# Patient Record
Sex: Female | Born: 1980
Health system: Southern US, Community
[De-identification: ages and names within clinical notes are randomized; demographics above are authoritative.]

## PROBLEM LIST (undated history)

## (undated) DIAGNOSIS — E559 Vitamin D deficiency, unspecified: Secondary | ICD-10-CM

## (undated) DIAGNOSIS — R87612 Low grade squamous intraepithelial lesion on cytologic smear of cervix (LGSIL): Secondary | ICD-10-CM

## (undated) DIAGNOSIS — IMO0002 Reserved for concepts with insufficient information to code with codable children: Secondary | ICD-10-CM

## (undated) DIAGNOSIS — L732 Hidradenitis suppurativa: Secondary | ICD-10-CM

## (undated) DIAGNOSIS — D573 Sickle-cell trait: Secondary | ICD-10-CM

## (undated) DIAGNOSIS — D649 Anemia, unspecified: Secondary | ICD-10-CM

## (undated) DIAGNOSIS — B977 Papillomavirus as the cause of diseases classified elsewhere: Secondary | ICD-10-CM

## (undated) HISTORY — DX: Sickle-cell trait: D57.3

## (undated) HISTORY — DX: Low grade squamous intraepithelial lesion on cytologic smear of cervix (LGSIL): R87.612

## (undated) HISTORY — DX: Papillomavirus as the cause of diseases classified elsewhere: B97.7

## (undated) HISTORY — DX: Anemia, unspecified: D64.9

## (undated) HISTORY — DX: Reserved for concepts with insufficient information to code with codable children: IMO0002

## (undated) HISTORY — DX: Vitamin D deficiency, unspecified: E55.9

---

## 2007-10-25 ENCOUNTER — Inpatient Hospital Stay (HOSPITAL_COMMUNITY): Admission: EM | Admit: 2007-10-25 | Discharge: 2007-10-27 | Payer: Self-pay | Admitting: Emergency Medicine

## 2007-10-27 ENCOUNTER — Ambulatory Visit: Payer: Self-pay | Admitting: Psychiatry

## 2007-12-18 ENCOUNTER — Other Ambulatory Visit: Admission: RE | Admit: 2007-12-18 | Discharge: 2007-12-18 | Payer: Self-pay | Admitting: Gynecology

## 2008-06-14 ENCOUNTER — Ambulatory Visit: Payer: Self-pay | Admitting: Women's Health

## 2008-06-28 HISTORY — PX: COLPOSCOPY: SHX161

## 2008-06-28 HISTORY — PX: COLONOSCOPY: SHX174

## 2008-09-20 ENCOUNTER — Ambulatory Visit: Payer: Self-pay | Admitting: Women's Health

## 2008-12-12 ENCOUNTER — Ambulatory Visit: Payer: Self-pay | Admitting: Women's Health

## 2009-02-12 ENCOUNTER — Emergency Department (HOSPITAL_COMMUNITY): Admission: EM | Admit: 2009-02-12 | Discharge: 2009-02-12 | Payer: Self-pay | Admitting: Emergency Medicine

## 2009-02-14 ENCOUNTER — Ambulatory Visit: Payer: Self-pay | Admitting: Women's Health

## 2009-02-14 ENCOUNTER — Encounter: Payer: Self-pay | Admitting: Women's Health

## 2009-02-14 ENCOUNTER — Other Ambulatory Visit: Admission: RE | Admit: 2009-02-14 | Discharge: 2009-02-14 | Payer: Self-pay | Admitting: Gynecology

## 2009-05-16 ENCOUNTER — Ambulatory Visit: Payer: Self-pay | Admitting: Women's Health

## 2009-05-30 ENCOUNTER — Ambulatory Visit: Payer: Self-pay | Admitting: Gynecology

## 2009-08-07 ENCOUNTER — Ambulatory Visit: Payer: Self-pay | Admitting: Women's Health

## 2010-05-29 ENCOUNTER — Ambulatory Visit: Payer: Self-pay | Admitting: Women's Health

## 2010-09-02 ENCOUNTER — Other Ambulatory Visit: Payer: Self-pay | Admitting: Internal Medicine

## 2010-09-07 ENCOUNTER — Other Ambulatory Visit: Payer: Self-pay

## 2010-09-14 ENCOUNTER — Other Ambulatory Visit: Payer: Self-pay

## 2010-11-10 NOTE — H&P (Signed)
[name], [name] NO.:  [id number]   MEDICAL RECORD NO.:  [id number]         PATIENT TYPE:  LINP   LOCATION:                               FACILITY:  [facility]   PHYSICIAN:  Altha Harm, MDDATE OF BIRTH:  [birth year]   DATE OF ADMISSION:  10/25/2007  DATE OF DISCHARGE:                              HISTORY & PHYSICAL   CHIEF COMPLAINT:  Tylenol ingestion.   HISTORY OF PRESENT ILLNESS:  This is a 30 year old, African-American  female who today ingested 20 Tylenol PM pills approximately one hour  prior to arrival to the emergency room.  The patient denies homicidal or  suicidal mediations and states that she was an argument with her  boyfriend and got angry.  She states that as a result of the argument  she had a headache and wanted to be able to sleep so she took some  Tylenol PM.  She states that she took a few initially and was unable to  rest, so she took additional Tylenol PM.  She shtatre that she was  unaware that Tylenol could have a detromental effect.  Again, the  patient vehemently denies any suicidal ideations.  The patient states  that she started feeling dizzy and nauseated which brought her to the  emergency room.   The patient denies any fever or chills.  She denies any feelings of  depression.  She denies any diarrhea.   The patient has a young baby and states that she feels that she has  every reason to live.  She has a family in [place], and she states  that she has a good relationship with her family.   PAST MEDICAL HISTORY:  The patient has no chronic illnesses.   FAMILY HISTORY:  Is unremarkable according to the patient's  recollection.   SOCIAL HISTORY:  The patient lives with her infant child.  She denies  any tobacco, alcohol or drug use.  She is from the [place] area and  has no family in this general area.  Her mother's name is [name],  and her phone number is [phone].  Please note that the patient's  boyfriend is here, and they appear to have normal interaction.   MEDICATIONS:  The patient is not on any chronic medications.   ALLERGIES:  No known drug allergies.   PRIMARY CARE PHYSICIAN:  The patient has no primary care physician.   REVIEW OF SYSTEMS:  Fourteen systems are reviewed; all systems are  negative except as noted in the HPI.   PHYSICAL EXAMINATION:  The patient is sitting in bed.  She appears  nontoxic, and she is in no acute distress.  HEENT EXAMINATION:  She is normocephalic, atraumatic.  Pupils equally  round and reactive to light and accommodation.  Extraocular movements  are intact.  Tympanic membranes are translucent bilaterally with good  landmarks.  Fundi are benign.  Oropharynx is moist.  No exudate,  erythema or lesions are noted.  NECK EXAMINATION:  Trachea is midline.  No masses, no thyromegaly, no  JVD, no carotid bruit.  RESPIRATORY EXAMINATION:  The patient has  normal respiratory effort,  equal excursion bilaterally.  No wheezing or rhonchi noted.  CARDIOVASCULAR:  She is tachycardiac.  A normal S1-S2 is noted.  No  murmurs, rubs or gallops.  PMI is nondisplaced.  No heaves or thrills on  palpation.  ABDOMINAL EXAMINATION:  The patient abdomen is flat, soft, nontender,  nondistended.  No masses, no hepatosplenomegaly.  No guarding or  rebound.  LYMPH NODE SURVEY:  She had got no cervical, axillary, or inguinal  lymphadenopathy.  NEUROLOGICAL:  No focal neurological deficits.  Cranial nerves II-XII  are grossly intact.  DTRs are 2+ bilaterally upper and lower  extremities.  Sensation is intact to light touch, pinprick and  proprioception.  PSYCHIATRIC:  She is alert and oriented x3.  She has got good insight  and cognition.  The patient appears somewhat afraid.  She states that  she is scared of the effects that the Tylenol may have on her, stating  that she does not want to die.  She is afraid to go to sleep, that she  may not wake up.  She has good  recent and remote recall.  The patient  also is concerned about how this might effect her job if this  information is disclosed.  I have assured the patient the information  will not be disclosed except at her directives.  It will not be  discussed with anyone except at her directives secondary to HIPPA  guidelines.   ASSESSMENT/PLAN:  This is a patient with an intentional ingestion of  Tylenol PM.  The patient is without suicidal ideations at this time but  may have some features of depression.  I do not believe that this  patient requires a sitter at this time as she is not suicidal, but she  may benefit from psychiatric evaluation secondary to features of  depression.  The patient will be admitted.  She will be given aggressive  hydration.  The patient apparently has not reached her four hours beyond  her Tylenol ingestion.  Thus, we will do a four-hour limit on it.  The  patient will, however, be treated with Mucomyst 140 mg/kg loading dose  and then 70 mg/kg q.4h. thereafter to total 17 doses.  The patient was  placed on a monitored bed, and we will recheck her as acetaminophen  levels in a.m.  In addition, the patient will have her PT/PTT checked at  this time. Her liver enzymes and tylenol will be rechecked inthe  morning. I will also check and EKG for any evidence of Q-T prolongation.      Altha Harm, MD  Electronically Signed     MAM/MEDQ  D:  10/25/2007  T:  10/25/2007  Job:  [id number]

## 2010-11-10 NOTE — Consult Note (Signed)
[name], [name] NO.:  [id number]   MEDICAL RECORD NO.:  [id number]          PATIENT TYPE:  INP   LOCATION:  1412                         FACILITY:  [facility]   PHYSICIAN:  Anselm Jungling, MD  DATE OF BIRTH:  28-Mar-1981   DATE OF CONSULTATION:  10/27/2007  DATE OF DISCHARGE:                                 CONSULTATION   REFERRING PHYSICIAN:  Altha Harm, MD   IDENTIFYING DATA AND REASON FOR REFERRAL:  The patient is a 30 year old  unmarried mother, who works at the call center at [employer].  She  is currently under the care of Dr. Ashley Royalty here at [facility]  in the aftermath of a drug ingestion.   HISTORY OF THE PRESENTING PROBLEMS:  The patient indicates, quite freely  and openly, that she took a large quantity of Tylenol and Benadryl  following some differences with her boyfriend, and a strong desire to go  to sleep; this ultimately led to her needing medical treatment here.  She has been treated and is now medically cleared.   She has no psychiatric history.  She denies any history of prior  overdoses or suicide attempts or gestures.  Aside from some family  counseling, she has not had any form of treatment including medication  treatment.  She has no history of depression, and no history of alcohol  or substance abuse.  Today, Dr. Ashley Royalty is anticipating her release  from the hospital.   MENTAL STATUS AND OBSERVATIONS:  The patient is a slender, normally-  developed young woman who is wearing her own clothes this morning, in  anticipation of discharge.  She is alert, fully oriented, and very open,  pleasant, and forms eye contact well.  Her mood is essentially neutral,  but some sad affect is present.  She expresses a great deal of  appropriate remorse and regret over her drug ingestion, and gives strong  reassurances that this was not a suicide attempt, but instead, merely a  desire to sleep at a time when she felt she could  not sleep and was  rather overwhelmed.   Her thoughts and speech are normally organized.  There is nothing to  suggest any underlying formal thought disorder, cognitive or memory  impairment or psychosis.   We discussed the need for individual counseling, and she agreed with  this.  We talked about the possibility of her going to the counseling  center at [employer], her employer, but she states that she would rather seek  services other than her employer, which is understandable.  I gave her  the name of [facility] here in [place], and indicated to  her that they would be a good resource for counseling, and if necessary,  medication treatment.   IMPRESSIONS:  The patient does not really demonstrate clinical criteria  of major depressive disorder.  Rather, this appears to be a maladaptive  response to a stressful situation.  The patient appears to regret it  appropriately and sincerely.  I do not feel that she is at risk for  further self-harm.  She agrees to seek outpatient counseling.   DIAGNOSTIC IMPRESSION:   AXIS I:  Adjustment disorder with mixed disturbance of emotions and  conduct.   AXIS II:  Deferred.   AXIS III:  No acute or chronic illnesses.   AXIS IV:  Stressors, severe.   AXIS V:  Global assessment of functioning 70.   RECOMMENDATIONS:  I gave her the name and contact information for the  [facility].  I feel comfortable with her discharge home  today.  Cell phone [phone].      Anselm Jungling, MD  Electronically Signed     SPB/MEDQ  D:  10/27/2007  T:  10/27/2007  Job:  [id number]

## 2010-11-10 NOTE — Discharge Summary (Signed)
NAMELATESSA, [name]               ACCOUNT NO.:  [id number]   MEDICAL RECORD NO.:  [id number]          PATIENT TYPE:  INP   LOCATION:  1412                         [facility]   PHYSICIAN:  Altha Harm, MDDATE OF BIRTH:  1980/12/10   DATE OF ADMISSION:  10/25/2007  DATE OF DISCHARGE:  10/27/2007                               DISCHARGE SUMMARY   DISCHARGE DISPOSITION:  Home.   FINAL DISCHARGE DIAGNOSES:  1. Tylenol ingestion, accidental.  2. Diphenhydramine ingestion, accidental.   DISCHARGE MEDICATIONS:  None.   CONSULTANTS:  Phone consultation with Dr. Electa Sniff, psychiatry.   CODE STATUS:  Full code.   ALLERGIES:  No known drug allergies.   PROCEDURES:  None.   DIAGNOSTIC STUDIES:  None.   PERTINENT LABORATORY STUDIES:  At the time of discharge the patient had  normal liver function tests.  Twenty-four hours prior to her discharge  the patient had an acetaminophen level of less than 10.   CHIEF COMPLAINT:  Tylenol ingestion.   HISTORY OF PRESENT ILLNESS:  Please see the H&P dictated by Dr. Ashley Royalty  on April 29 for details of the HPI.  However, this is a 30 year old  patient who denied any suicidal or homicidal ideations.  She states that  she was disgusted with her boyfriend and wanted to sleep so she took  Tylenol P.M., which she usually takes to sleep at night.  The patient  states that she got no sleep only taking the initial two so she kept  taking them until she felt that she could go to sleep.  The patient  states that she then developed some nausea and lightheadedness, which  caused her to come to the emergency room.   HOSPITAL COURSE:  The patient was admitted to the hospital.  A Tylenol  level was obtained including a 4-hour level, which was 91.  The patient  was started on Mucomyst with a loading dose of 140 mg/kg and then  subsequent to that 70 mg/kg q.4 h.  The patient again reiterated that  she was neither homicidal nor suicidal and that she  was very hopeful in  terms of her life.  I discussed this with Dr. Electa Sniff of psychiatry ,  who felt that given the circumstances there was no need for any in  patient psychiatric evaluation.  The patient was in the hospital here  without a sitter and had no incident.  The patient improved clinically  while being supported with IV fluids.  She continued to have no  deterioration in her clinical status.  She had no elevation in her liver  enzymes or derangement of her coagulation function.  I discussed the  case with the poison control center, who stated that after the patient  had completed 10 doses she could be discharged as long as she had normal  LFTs and no clinical deterioration.  At this point the patient is stable  for discharge.  Her vital signs at this time are as follows:  Temperature 98, heart rate 87, blood pressure 108/63, respiratory rate  16, O2 saturations are 100% on room air.  ALT is 14, AST is 15, alkaline  phosphatase 41.  All other electrolytes are normal and her creatinine is  0.85 with a BUN of 6.  The patient is clinically stable for discharge.   MEDICATIONS AT DISCHARGE:  Zoloft 50 mg P.O. Daily.   FOLLOW-UP:  The patient is currently in family counseling, and she is to  continue in her family counseling.  The patient is to follow up with her  primary care physician on an as-needed basis.  She has been given out  patient  psychiatric resiurces by Dr. Electa Sniff to follow up as an pit  patient.      Altha Harm, MD  Electronically Signed     MAM/MEDQ  D:  10/27/2007  T:  10/27/2007  Job:  [id number]

## 2010-11-21 ENCOUNTER — Emergency Department (HOSPITAL_COMMUNITY): Payer: 59

## 2010-11-21 ENCOUNTER — Emergency Department (HOSPITAL_COMMUNITY)
Admission: EM | Admit: 2010-11-21 | Discharge: 2010-11-21 | Disposition: A | Discharge status: Home / Self Care | Payer: 59 | Attending: Emergency Medicine | Admitting: Emergency Medicine

## 2010-11-21 DIAGNOSIS — S8990XA Unspecified injury of unspecified lower leg, initial encounter: Secondary | ICD-10-CM | POA: Insufficient documentation

## 2010-11-21 DIAGNOSIS — S99929A Unspecified injury of unspecified foot, initial encounter: Secondary | ICD-10-CM | POA: Insufficient documentation

## 2010-11-21 DIAGNOSIS — M79609 Pain in unspecified limb: Secondary | ICD-10-CM | POA: Insufficient documentation

## 2010-11-21 DIAGNOSIS — M7989 Other specified soft tissue disorders: Secondary | ICD-10-CM | POA: Insufficient documentation

## 2010-11-21 DIAGNOSIS — W208XXA Other cause of strike by thrown, projected or falling object, initial encounter: Secondary | ICD-10-CM | POA: Insufficient documentation

## 2010-11-21 DIAGNOSIS — Y92009 Unspecified place in unspecified non-institutional (private) residence as the place of occurrence of the external cause: Secondary | ICD-10-CM | POA: Insufficient documentation

## 2010-11-21 DIAGNOSIS — S9030XA Contusion of unspecified foot, initial encounter: Secondary | ICD-10-CM | POA: Insufficient documentation

## 2011-03-23 LAB — HEPATIC FUNCTION PANEL
ALT: 16
AST: 22
Albumin: 3.3 — ABNORMAL LOW
Alkaline Phosphatase: 39
Alkaline Phosphatase: 46
Bilirubin, Direct: 0.1
Indirect Bilirubin: 0.6
Total Bilirubin: 0.8
Total Protein: 8

## 2011-03-23 LAB — DIFFERENTIAL
Eosinophils Relative: 4
Lymphocytes Relative: 17
Lymphs Abs: 1.6
Monocytes Absolute: 0.4
Neutro Abs: 7.1

## 2011-03-23 LAB — BASIC METABOLIC PANEL
GFR calc Af Amer: >60
GFR calc non Af Amer: >60
Potassium: 3.9
Sodium: 142

## 2011-03-23 LAB — RAPID URINE DRUG SCREEN, HOSP PERFORMED
Barbiturates: NOT DETECTED
Cocaine: NOT DETECTED
Opiates: NOT DETECTED
Tetrahydrocannabinol: NOT DETECTED

## 2011-03-23 LAB — APTT: aPTT: 33

## 2011-03-23 LAB — PHOSPHORUS: Phosphorus: 2.3

## 2011-03-23 LAB — MAGNESIUM: Magnesium: 1.9

## 2011-03-23 LAB — CBC
HCT: 42.9
Hemoglobin: 14.5
RBC: 5.5 — ABNORMAL HIGH
WBC: 9.5

## 2011-03-23 LAB — PROTIME-INR
INR: 1.2
Prothrombin Time: 15.7 — ABNORMAL HIGH

## 2011-03-23 LAB — ACETAMINOPHEN LEVEL: Acetaminophen (Tylenol), Serum: <10 — ABNORMAL LOW

## 2011-05-10 ENCOUNTER — Ambulatory Visit (INDEPENDENT_AMBULATORY_CARE_PROVIDER_SITE_OTHER): Payer: 59 | Admitting: Women's Health

## 2011-05-10 ENCOUNTER — Other Ambulatory Visit (HOSPITAL_COMMUNITY)
Admission: RE | Admit: 2011-05-10 | Discharge: 2011-05-10 | Disposition: A | Discharge status: Home / Self Care | Payer: 59 | Source: Ambulatory Visit | Attending: Women's Health | Admitting: Women's Health

## 2011-05-10 ENCOUNTER — Encounter: Payer: Self-pay | Admitting: Women's Health

## 2011-05-10 VITALS — BP 110/70 | Ht 66.25 in | Wt 149.0 lb

## 2011-05-10 DIAGNOSIS — B9689 Other specified bacterial agents as the cause of diseases classified elsewhere: Secondary | ICD-10-CM

## 2011-05-10 DIAGNOSIS — R87612 Low grade squamous intraepithelial lesion on cytologic smear of cervix (LGSIL): Secondary | ICD-10-CM | POA: Insufficient documentation

## 2011-05-10 DIAGNOSIS — N898 Other specified noninflammatory disorders of vagina: Secondary | ICD-10-CM

## 2011-05-10 DIAGNOSIS — B373 Candidiasis of vulva and vagina: Secondary | ICD-10-CM

## 2011-05-10 DIAGNOSIS — Z01419 Encounter for gynecological examination (general) (routine) without abnormal findings: Secondary | ICD-10-CM | POA: Insufficient documentation

## 2011-05-10 DIAGNOSIS — A499 Bacterial infection, unspecified: Secondary | ICD-10-CM

## 2011-05-10 DIAGNOSIS — N760 Acute vaginitis: Secondary | ICD-10-CM

## 2011-05-10 DIAGNOSIS — L293 Anogenital pruritus, unspecified: Secondary | ICD-10-CM

## 2011-05-10 DIAGNOSIS — Z309 Encounter for contraceptive management, unspecified: Secondary | ICD-10-CM

## 2011-05-10 DIAGNOSIS — IMO0002 Reserved for concepts with insufficient information to code with codable children: Secondary | ICD-10-CM | POA: Insufficient documentation

## 2011-05-10 DIAGNOSIS — IMO0001 Reserved for inherently not codable concepts without codable children: Secondary | ICD-10-CM

## 2011-05-10 MED ORDER — FLUCONAZOLE 150 MG PO TABS: 150.0000 mg | ORAL_TABLET | Freq: Once | ORAL | Status: AC

## 2011-05-10 MED ORDER — MEDROXYPROGESTERONE ACETATE 150 MG/ML IM SUSP: 150.0000 mg | Freq: Once | INTRAMUSCULAR | Status: DC

## 2011-05-10 MED ORDER — METRONIDAZOLE 0.75 % VA GEL: VAGINAL | Status: AC

## 2011-05-10 NOTE — Progress Notes (Signed)
[name] [birth year] [id number]    History:    The patient presents for annual exam.  30-year-old son Zyrere doing well, works at a bank.   Past medical history, past surgical history, family history and social history were all reviewed and documented in the EPIC chart.   ROS:  A  ROS was performed and pertinent positives and negatives are included in the history.  Exam:  Filed Vitals:   05/10/11 1155  BP: 110/70    General appearance:  Normal Head/Neck:  Normal, without cervical or supraclavicular adenopathy. Thyroid:  Symmetrical, normal in size, without palpable masses or nodularity. Respiratory  Effort:  Normal  Auscultation:  Clear without wheezing or rhonchi Cardiovascular  Auscultation:  Regular rate, without rubs, murmurs or gallops  Edema/varicosities:  Not grossly evident Abdominal  Soft,nontender, without masses, guarding or rebound.  Liver/spleen:  No organomegaly noted  Hernia:  None appreciated  Skin  Inspection:  Grossly normal  Palpation:  Grossly normal Neurologic/psychiatric  Orientation:  Normal with appropriate conversation.  Mood/affect:  Normal  Genitourinary    Breasts: Examined lying and sitting.     Right: Without masses, retractions, discharge or axillary adenopathy.     Left: Without masses, retractions, discharge or axillary adenopathy.   Inguinal/mons:  Normal without inguinal adenopathy  External genitalia:  Normal  BUS/Urethra/Skene's glands:  Normal  Bladder:  Normal  Vagina:  Normal  Cervix:  Normal  Uterus:   normal in size, shape and contour.  Midline and mobile  Adnexa/parametria:     Rt: Without masses or tenderness.   Lt: Without masses or tenderness.  Anus and perineum: Normal  Digital rectal exam: Normal sphincter tone without palpated masses or tenderness  Assessment/Plan:  30 y.o. SBF G2 P1  for annual exam monthly 5-7 day cycle/withdraw. Complaint of discharge. C&B in 2010 CIN-1 positive high-risk HPV, did not  return for followup Pap.  Contraceptive counseling BV and yeast  Plan: MetroGel vaginal cream 1 applicator at bedtime x5, and Diflucan 150 by mouth x1 dose. Prescriptions, proper use were given for both. Contraceptives discussed, used Depo-Provera in past, had no weight gain and was amenorrheic. Depo-Provera 150 IM every 12 weeks, prescription, proper use given, and abstain until next cycle and return for injection. Will send Pap recall for 6 months, did review view the importance of having 4 normal and then proceed with annual screening. SBEs, exercise, MVI daily, calcium rich diet encouraged. She does not plan more children.  UA and Pap. States had normal labs at primary care several months ago.   YOUNG,[name] WHNP, 1:40 PM 05/10/2011

## 2011-05-17 ENCOUNTER — Other Ambulatory Visit: Payer: 59

## 2011-05-17 ENCOUNTER — Ambulatory Visit (INDEPENDENT_AMBULATORY_CARE_PROVIDER_SITE_OTHER): Payer: 59 | Admitting: *Deleted

## 2011-05-17 ENCOUNTER — Ambulatory Visit: Payer: 59 | Admitting: Women's Health

## 2011-05-17 DIAGNOSIS — Z3049 Encounter for surveillance of other contraceptives: Secondary | ICD-10-CM

## 2011-05-17 MED ORDER — MEDROXYPROGESTERONE ACETATE 150 MG/ML IM SUSP
150.0000 mg | Freq: Once | INTRAMUSCULAR | Status: AC
  Administered 2011-05-17: 150 mg via INTRAMUSCULAR

## 2011-08-06 ENCOUNTER — Ambulatory Visit (INDEPENDENT_AMBULATORY_CARE_PROVIDER_SITE_OTHER): Payer: 59

## 2011-08-06 DIAGNOSIS — IMO0001 Reserved for inherently not codable concepts without codable children: Secondary | ICD-10-CM

## 2011-08-06 DIAGNOSIS — Z3049 Encounter for surveillance of other contraceptives: Secondary | ICD-10-CM

## 2011-08-06 DIAGNOSIS — Z309 Encounter for contraceptive management, unspecified: Secondary | ICD-10-CM

## 2011-08-06 DIAGNOSIS — Z01419 Encounter for gynecological examination (general) (routine) without abnormal findings: Secondary | ICD-10-CM

## 2011-08-06 MED ORDER — MEDROXYPROGESTERONE ACETATE 150 MG/ML IM SUSP: 150.0000 mg | Freq: Once | INTRAMUSCULAR | Status: DC

## 2011-08-06 MED ORDER — MEDROXYPROGESTERONE ACETATE 150 MG/ML IM SUSP
150.0000 mg | Freq: Once | INTRAMUSCULAR | Status: AC
  Administered 2011-08-06: 150 mg via INTRAMUSCULAR

## 2011-09-02 ENCOUNTER — Ambulatory Visit: Payer: 59

## 2011-09-02 ENCOUNTER — Ambulatory Visit (INDEPENDENT_AMBULATORY_CARE_PROVIDER_SITE_OTHER): Payer: 59 | Admitting: Family Medicine

## 2011-09-02 DIAGNOSIS — R059 Cough, unspecified: Secondary | ICD-10-CM

## 2011-09-02 DIAGNOSIS — J111 Influenza due to unidentified influenza virus with other respiratory manifestations: Secondary | ICD-10-CM

## 2011-09-02 DIAGNOSIS — R05 Cough: Secondary | ICD-10-CM

## 2011-09-02 DIAGNOSIS — M25561 Pain in right knee: Secondary | ICD-10-CM

## 2011-09-02 DIAGNOSIS — R509 Fever, unspecified: Secondary | ICD-10-CM

## 2011-09-02 DIAGNOSIS — J029 Acute pharyngitis, unspecified: Secondary | ICD-10-CM

## 2011-09-02 DIAGNOSIS — M25559 Pain in unspecified hip: Secondary | ICD-10-CM

## 2011-09-02 LAB — POCT INFLUENZA A/B
Influenza A, POC: NEGATIVE
Influenza B, POC: POSITIVE

## 2011-09-02 MED ORDER — OSELTAMIVIR PHOSPHATE 75 MG PO CAPS: 75.0000 mg | ORAL_CAPSULE | Freq: Two times a day (BID) | ORAL | Status: AC

## 2011-09-02 MED ORDER — IBUPROFEN 200 MG PO TABS: 600.0000 mg | ORAL_TABLET | Freq: Once | ORAL | Status: DC

## 2011-09-02 MED ORDER — HYDROCODONE-HOMATROPINE 5-1.5 MG/5ML PO SYRP: 5.0000 mL | ORAL_SOLUTION | Freq: Three times a day (TID) | ORAL | Status: AC | PRN

## 2011-09-02 MED ORDER — BENZONATATE 200 MG PO CAPS: 200.0000 mg | ORAL_CAPSULE | Freq: Two times a day (BID) | ORAL | Status: AC | PRN

## 2011-09-02 NOTE — Progress Notes (Signed)
  [facility]:  Office Visit  Chief Complaint:  Chief Complaint  Patient presents with  . Cough    x 4 days  progressing  nyquil, robitussin  not helping  . Fever    x 4 days      HPI: [name] is a 31 y.o. female who complains of 4 day h/o cough, fevers, chills, sore throat. Tried OTC meds without relief. Feels msk pain and generalized weakness. Patient was seen at another urgent care and given amoxacillin.   Past Medical History  Diagnosis Date  . LGSIL (low grade squamous intraepithelial lesion) on Pap smear 02/14/2009    CIN 1/VAIN-1, HPV detected  . High risk HPV infection 01/2009   Past Surgical History  Procedure Date  . Colposcopy 2010    C & B   History   Social History  . Marital Status: Single    Spouse Name: N/A    Number of Children: N/A  . Years of Education: N/A   Social History Main Topics  . Smoking status: Never Smoker   . Smokeless tobacco: Never Used  . Alcohol Use: No  . Drug Use: No  . Sexually Active: Yes -- Female partner(s)    Birth Control/ Protection: Coitus interruptus   Other Topics Concern  . None   Social History Narrative  . None   Family History  Problem Relation Age of Onset  . Diabetes Father   . Diabetes Maternal Grandmother    No Known Allergies Prior to Admission medications   Medication Sig Start Date End Date Taking? Authorizing Provider  AMOXICILLIN PO Take by mouth.   Yes Historical Provider, MD  medroxyPROGESTERone (DEPO-PROVERA) 150 MG/ML injection Inject 1 mL (150 mg total) into the muscle once. 08/06/11  Yes Harrington Challenger, NP     ROS: The patient denies fevers, chills, night sweats, unintentional weight loss, chest pain, palpitations, wheezing, dyspnea on exertion, nausea, vomiting, abdominal pain, dysuria, hematuria, melena, numbness, weakness, or tingling. + fever  All other systems have been reviewed and were otherwise negative with the exception of those mentioned in the HPI and as  above.    PHYSICAL EXAM: Filed Vitals:   09/02/11 1910  BP: 128/81  Pulse: 106  Temp: 102.9 F (39.4 C)  Resp: 16   Filed Vitals:   09/02/11 1910  Height: 5' 6.5" (1.689 m)  Weight: 150 lb (68.04 kg)   Body mass index is 23.85 kg/(m^2).  General: Alert, no acute distress HEENT:  Normocephalic, atraumatic, oropharynx patent. + erythematous nares, left greater than right, no sinus pressure. Tm normal. Erythematous OP. No exudates.  Cardiovascular:  Regular rate and rhythm, no rubs murmurs or gallops.  No Carotid bruits, radial pulse intact. No pedal edema.  Respiratory: Clear to auscultation bilaterally.  No wheezes, rales, or rhonchi.  No cyanosis, no use of accessory musculature GI: No organomegaly, abdomen is soft and non-tender, positive bowel sounds.  No masses. Skin: No rashes. Neurologic: Facial musculature symmetric. Psychiatric: Patient is appropriate throughout our interaction. Lymphatic: No cervical lymphadenopathy Musculoskeletal: Gait intact.   LABS: Flu B positive   EKG/XRAY:   Primary read interpreted by Dr. Conley Rolls at [facility]. Increased vascularization and hilar LAD but no infiltrates, pneumothorax   ASSESSMENT/PLAN: 1. Fever chills-Motrin and Tylenol prn 2. Cough-Tessalon and hydromet 3. Pharyngitis-sxs treatmetn with salt water gargles 4. Flu B Positive-Tamiflu    Marty Sadlowski PHUONG, DO 09/04/2011 11:33 AM

## 2011-10-29 ENCOUNTER — Ambulatory Visit: Payer: 59

## 2011-11-04 ENCOUNTER — Ambulatory Visit: Payer: 59 | Admitting: Women's Health

## 2012-01-14 ENCOUNTER — Ambulatory Visit (INDEPENDENT_AMBULATORY_CARE_PROVIDER_SITE_OTHER): Payer: 59 | Admitting: Anesthesiology

## 2012-01-14 ENCOUNTER — Ambulatory Visit (INDEPENDENT_AMBULATORY_CARE_PROVIDER_SITE_OTHER): Payer: 59 | Admitting: Women's Health

## 2012-01-14 DIAGNOSIS — N912 Amenorrhea, unspecified: Secondary | ICD-10-CM

## 2012-01-14 DIAGNOSIS — Z309 Encounter for contraceptive management, unspecified: Secondary | ICD-10-CM

## 2012-01-14 LAB — PREGNANCY, URINE: Preg Test, Ur: NEGATIVE

## 2012-01-14 MED ORDER — MEDROXYPROGESTERONE ACETATE 150 MG/ML IM SUSP
150.0000 mg | Freq: Once | INTRAMUSCULAR | Status: AC
  Administered 2012-01-14: 150 mg via INTRAMUSCULAR

## 2012-01-14 NOTE — Progress Notes (Signed)
Patient ID: [name], female   DOB: 19-Mar-1981, 31 y.o.   MRN: [id number] Presents for Depo-Provera, patient states give her last depo to herself.Prefers to have depo administered here. U PT negative today depo given with instructions to return in 12 weeks.

## 2012-07-06 ENCOUNTER — Ambulatory Visit (INDEPENDENT_AMBULATORY_CARE_PROVIDER_SITE_OTHER): Payer: 59 | Admitting: Gynecology

## 2012-07-06 DIAGNOSIS — Z309 Encounter for contraceptive management, unspecified: Secondary | ICD-10-CM

## 2012-07-06 DIAGNOSIS — IMO0001 Reserved for inherently not codable concepts without codable children: Secondary | ICD-10-CM

## 2012-07-06 MED ORDER — MEDROXYPROGESTERONE ACETATE 150 MG/ML IM SUSP
150.0000 mg | Freq: Once | INTRAMUSCULAR | Status: AC
  Administered 2012-07-06: 150 mg via INTRAMUSCULAR

## 2012-07-10 ENCOUNTER — Other Ambulatory Visit (HOSPITAL_COMMUNITY)
Admission: RE | Admit: 2012-07-10 | Discharge: 2012-07-10 | Disposition: A | Discharge status: Home / Self Care | Payer: 59 | Source: Ambulatory Visit | Attending: Women's Health | Admitting: Women's Health

## 2012-07-10 ENCOUNTER — Encounter: Payer: Self-pay | Admitting: Women's Health

## 2012-07-10 ENCOUNTER — Ambulatory Visit (INDEPENDENT_AMBULATORY_CARE_PROVIDER_SITE_OTHER): Payer: 59 | Admitting: Women's Health

## 2012-07-10 VITALS — BP 120/80 | Ht 66.0 in | Wt 157.0 lb

## 2012-07-10 DIAGNOSIS — Z309 Encounter for contraceptive management, unspecified: Secondary | ICD-10-CM

## 2012-07-10 DIAGNOSIS — Z01419 Encounter for gynecological examination (general) (routine) without abnormal findings: Secondary | ICD-10-CM

## 2012-07-10 DIAGNOSIS — R6889 Other general symptoms and signs: Secondary | ICD-10-CM

## 2012-07-10 DIAGNOSIS — IMO0002 Reserved for concepts with insufficient information to code with codable children: Secondary | ICD-10-CM

## 2012-07-10 DIAGNOSIS — IMO0001 Reserved for inherently not codable concepts without codable children: Secondary | ICD-10-CM

## 2012-07-10 DIAGNOSIS — Z1151 Encounter for screening for human papillomavirus (HPV): Secondary | ICD-10-CM | POA: Insufficient documentation

## 2012-07-10 MED ORDER — MEDROXYPROGESTERONE ACETATE 150 MG/ML IM SUSP: 150.0000 mg | Freq: Once | INTRAMUSCULAR | Status: DC

## 2012-07-10 NOTE — Patient Instructions (Signed)

## 2012-07-10 NOTE — Assessment & Plan Note (Signed)
Negative colposcopy 2010

## 2012-07-10 NOTE — Progress Notes (Signed)
[name] 09/06/1980 [id number]    History:    The patient presents for annual exam.  Amenorrheic on Depo-Provera.  Same partner. History of LGSIL/CIN-1 with positive HR HPV August 2010/ negative colposcopy. Normal Pap 04/2011.   Past medical history, past surgical history, family history and social history were all reviewed and documented in the EPIC chart. Works at  [employer]. Son [place] 6 and doing well.   ROS:  A  ROS was performed and pertinent positives and negatives are included in the history.  Exam:  Filed Vitals:   07/10/12 1116  BP: 120/80    General appearance:  Normal Head/Neck:  Normal, without cervical or supraclavicular adenopathy. Thyroid:  Symmetrical, normal in size, without palpable masses or nodularity. Respiratory  Effort:  Normal  Auscultation:  Clear without wheezing or rhonchi Cardiovascular  Auscultation:  Regular rate, without rubs, murmurs or gallops  Edema/varicosities:  Not grossly evident Abdominal  Soft,nontender, without masses, guarding or rebound.  Liver/spleen:  No organomegaly noted  Hernia:  None appreciated  Skin  Inspection:  Grossly normal  Palpation:  Grossly normal Neurologic/psychiatric  Orientation:  Normal with appropriate conversation.  Mood/affect:  Normal  Genitourinary    Breasts: Examined lying and sitting.     Right: Without masses, retractions, discharge or axillary adenopathy.     Left: Without masses, retractions, discharge or axillary adenopathy.   Inguinal/mons:  Normal without inguinal adenopathy  External genitalia:  Normal  BUS/Urethra/Skene's glands:  Normal  Bladder:  Normal  Vagina:  Normal  Cervix:  Normal  Uterus:   normal in size, shape and contour.  Midline and mobile  Adnexa/parametria:     Rt: Without masses or tenderness.   Lt: Without masses or tenderness.  Anus and perineum: Normal  Digital rectal exam: Normal sphincter tone without palpated masses or tenderness  Assessment/Plan:  32 y.o. SBF  G1 P1 for annual exam with no complaints.  LGSIL positive HR HPV 2010/negative colposcopy normal Pap 2012 Amenorrheic on Depo-Provera  Plan: Contraception options reviewed, Depo-Provera 150 every 12 weeks IM prescription, proper use given and reviewed. SBE's, exercise, importance of calcium rich diet reviewed, MVI daily. CBC, UA, Pap. Reviewed importance of annual exams, new screening guidelines reviewed.   Harrington Challenger WHNP, 1:21 PM 07/10/2012

## 2012-07-11 LAB — URINALYSIS W MICROSCOPIC + REFLEX CULTURE
Bacteria, UA: NONE SEEN
Crystals: NONE SEEN
Nitrite: NEGATIVE
Protein, ur: NEGATIVE mg/dL
Specific Gravity, Urine: 1.02 (ref 1.005–1.030)
Squamous Epithelial / LPF: NONE SEEN
Urobilinogen, UA: 0.2 mg/dL (ref 0.0–1.0)

## 2012-07-27 IMAGING — CR DG CHEST 2V
2 series · 2 of 2 positions shown · non-contrast
Comparison: None

CLINICAL DATA: Cough and fever

CHEST - 2 VIEW

[PA]
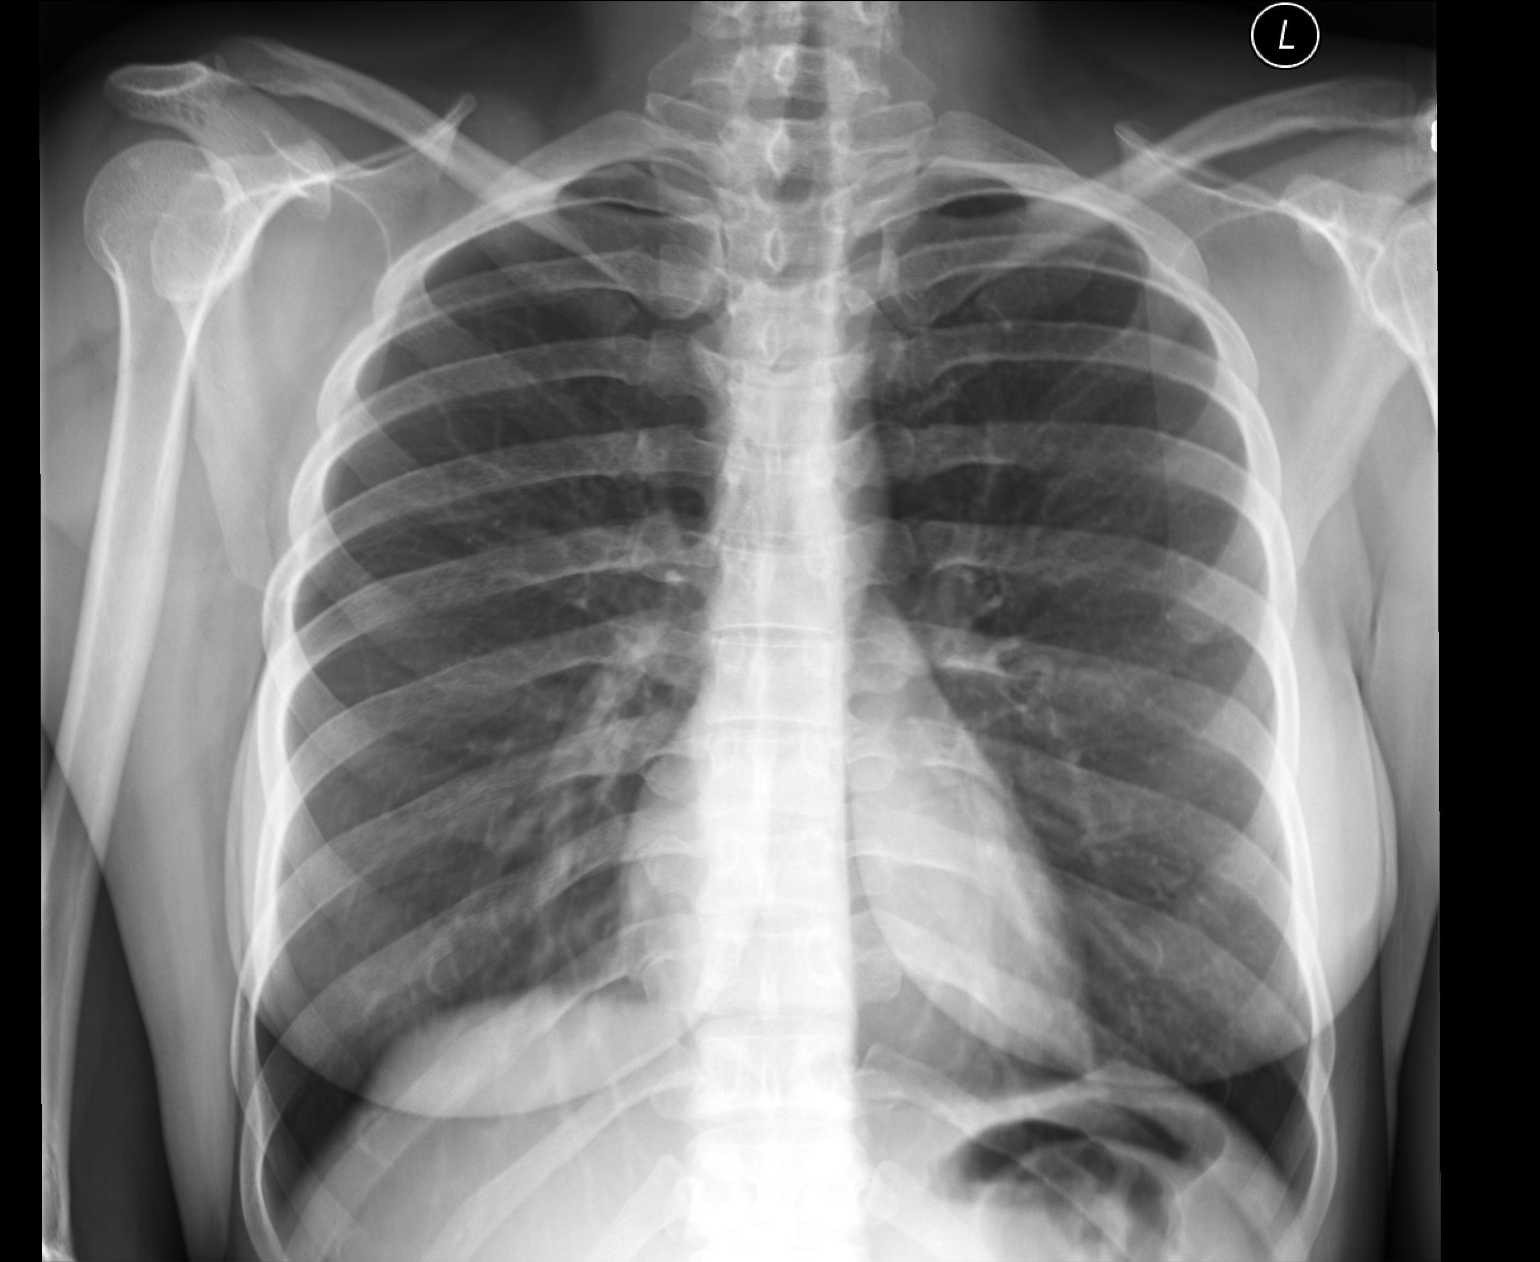

[lateral]
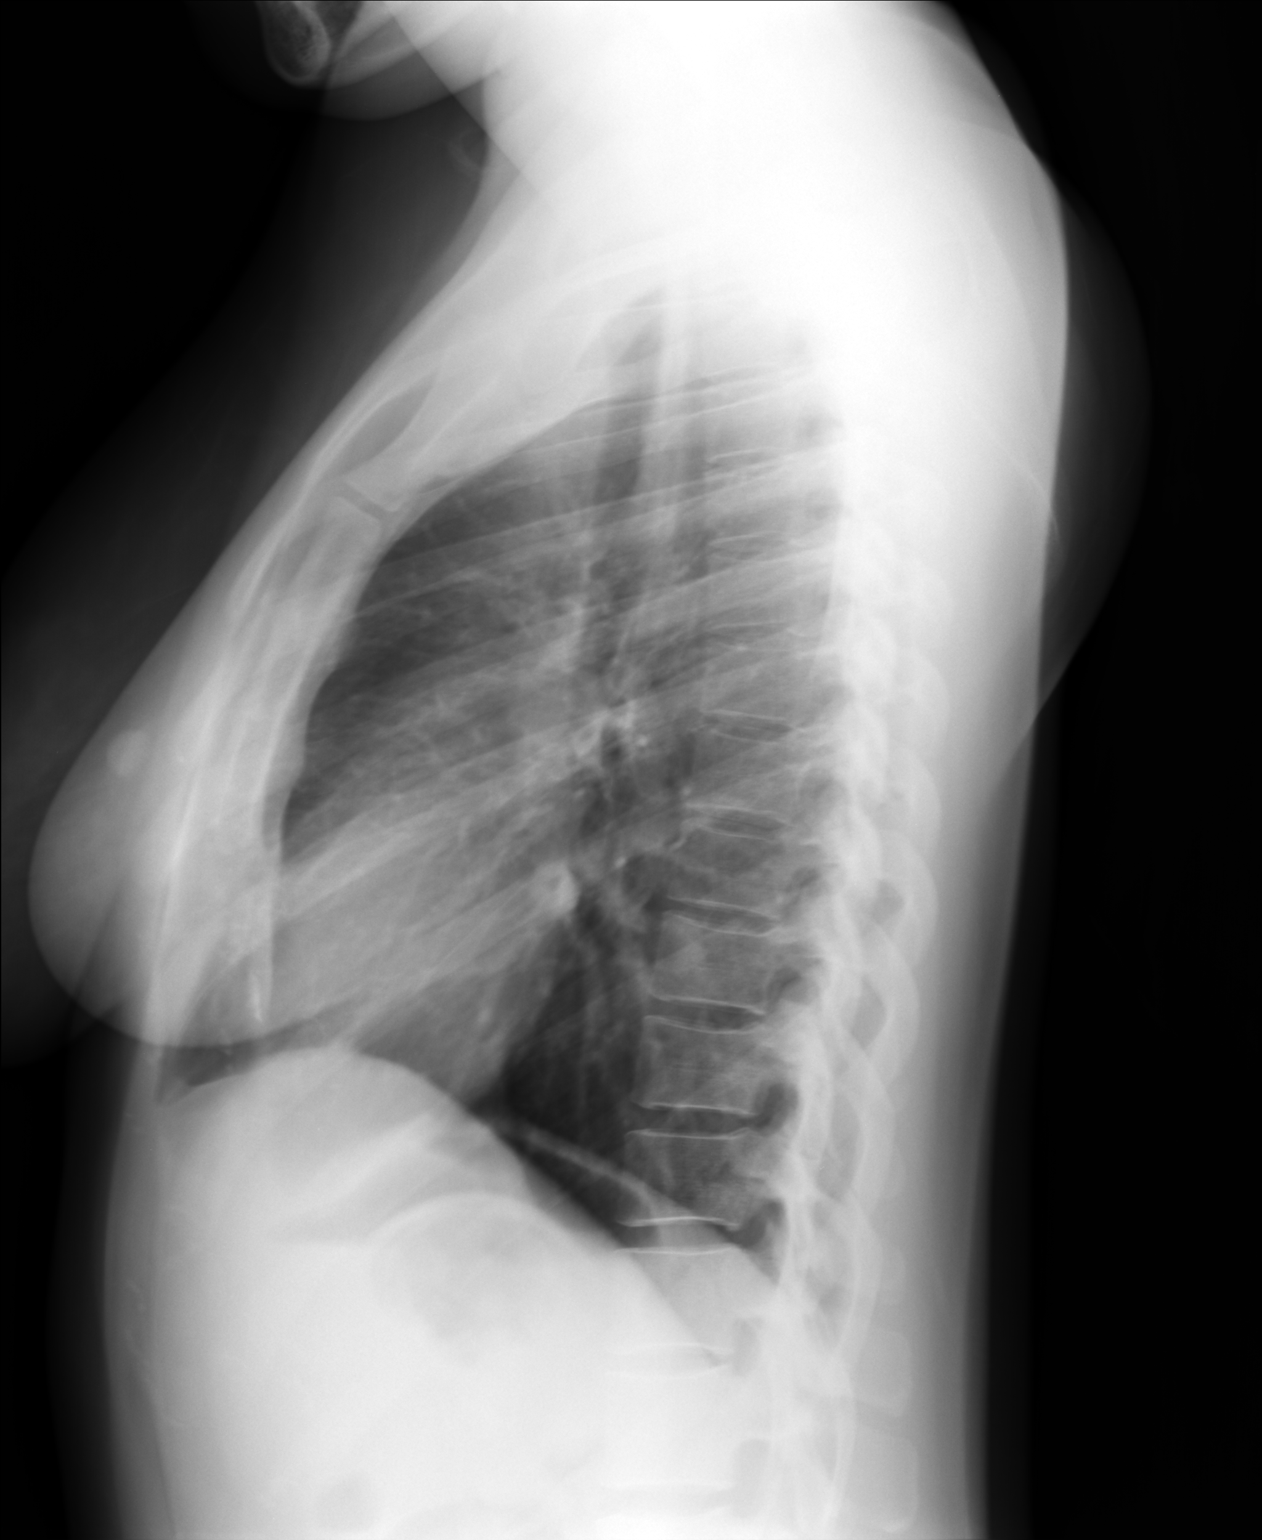

[2 of 2 positions shown; findings below may reference images not displayed]

FINDINGS: Heart size is normal.

No pleural effusion or pulmonary edema.

No airspace consolidation identified.

Review of the visualized osseous structures is unremarkable.
IMPRESSION: 1.  No active cardiopulmonary abnormalities.]

## 2012-09-22 ENCOUNTER — Ambulatory Visit (INDEPENDENT_AMBULATORY_CARE_PROVIDER_SITE_OTHER): Payer: 59 | Admitting: *Deleted

## 2012-09-22 DIAGNOSIS — Z3049 Encounter for surveillance of other contraceptives: Secondary | ICD-10-CM

## 2012-09-22 MED ORDER — MEDROXYPROGESTERONE ACETATE 150 MG/ML IM SUSP
150.0000 mg | Freq: Once | INTRAMUSCULAR | Status: AC
  Administered 2012-09-22: 150 mg via INTRAMUSCULAR

## 2012-12-04 ENCOUNTER — Encounter: Payer: Self-pay | Admitting: Women's Health

## 2012-12-04 ENCOUNTER — Ambulatory Visit (INDEPENDENT_AMBULATORY_CARE_PROVIDER_SITE_OTHER): Payer: 59 | Admitting: Women's Health

## 2012-12-04 DIAGNOSIS — Z113 Encounter for screening for infections with a predominantly sexual mode of transmission: Secondary | ICD-10-CM

## 2012-12-04 NOTE — Progress Notes (Signed)
Patient ID: [name], female   DOB: 19-Nov-1980, 32 y.o.   MRN: [id number] Presents with request for STD screen, long-term partner had multiple other partners prior to breakup. Asymptomatic, denies discharge, urinary symptoms, abdominal pain or fever. Contraceptives on Depo-Provera, due next week.  Exam: Tearful, external genitalia within normal limits, speculum exam cervix pink healthy without lesion or discharge visible, GC/Chlamydia culture taken. Bimanual no CMT or adnexal fullness or tenderness.  STD screen  Plan: GC/Chlamydia culture pending, HIV, hep B, C., RPR, HSV IgG. Condoms encouraged when sexually active. Counseling encouraged. Return as scheduled for Depo-Provera.

## 2012-12-04 NOTE — Patient Instructions (Addendum)

## 2012-12-05 LAB — HIV ANTIBODY (ROUTINE TESTING W REFLEX): HIV: NONREACTIVE

## 2012-12-05 LAB — HEPATITIS B SURFACE ANTIGEN: Hepatitis B Surface Ag: NEGATIVE

## 2012-12-05 LAB — GC/CHLAMYDIA PROBE AMP: GC Probe RNA: NEGATIVE

## 2012-12-06 ENCOUNTER — Other Ambulatory Visit: Payer: Self-pay | Admitting: Women's Health

## 2012-12-06 MED ORDER — VALACYCLOVIR HCL 500 MG PO TABS: ORAL_TABLET | ORAL | Status: DC

## 2012-12-08 ENCOUNTER — Telehealth: Payer: Self-pay | Admitting: *Deleted

## 2012-12-08 NOTE — Telephone Encounter (Signed)
Pt informed with the below note. 

## 2012-12-08 NOTE — Telephone Encounter (Signed)
Pt called this am to let you know that she has been having a outbreak on her chin that she believes maybe HSV outbreak which at the time she thought it was acne. She called requesting suppressive therapy medication, I explained to her that she could still use the valtrex 500 mg twice daily for 3-5 days and then daily after if needed for this as well since you gave her 12 refills. Please advise if addtional information you would like me to inform patient.

## 2012-12-08 NOTE — Telephone Encounter (Signed)
No, great advice, thanks

## 2013-07-07 ENCOUNTER — Encounter (HOSPITAL_COMMUNITY): Payer: Self-pay | Admitting: Emergency Medicine

## 2013-07-07 ENCOUNTER — Emergency Department (HOSPITAL_COMMUNITY)
Admission: EM | Admit: 2013-07-07 | Discharge: 2013-07-07 | Disposition: A | Discharge status: Home / Self Care | Payer: 59 | Attending: Emergency Medicine | Admitting: Emergency Medicine

## 2013-07-07 DIAGNOSIS — H9209 Otalgia, unspecified ear: Secondary | ICD-10-CM | POA: Insufficient documentation

## 2013-07-07 DIAGNOSIS — R509 Fever, unspecified: Secondary | ICD-10-CM | POA: Insufficient documentation

## 2013-07-07 DIAGNOSIS — R5381 Other malaise: Secondary | ICD-10-CM | POA: Insufficient documentation

## 2013-07-07 DIAGNOSIS — IMO0001 Reserved for inherently not codable concepts without codable children: Secondary | ICD-10-CM | POA: Insufficient documentation

## 2013-07-07 DIAGNOSIS — Z79899 Other long term (current) drug therapy: Secondary | ICD-10-CM | POA: Insufficient documentation

## 2013-07-07 DIAGNOSIS — J020 Streptococcal pharyngitis: Secondary | ICD-10-CM

## 2013-07-07 DIAGNOSIS — R599 Enlarged lymph nodes, unspecified: Secondary | ICD-10-CM | POA: Insufficient documentation

## 2013-07-07 DIAGNOSIS — R1110 Vomiting, unspecified: Secondary | ICD-10-CM | POA: Insufficient documentation

## 2013-07-07 DIAGNOSIS — R5383 Other fatigue: Secondary | ICD-10-CM

## 2013-07-07 LAB — RAPID STREP SCREEN (MED CTR MEBANE ONLY): Streptococcus, Group A Screen (Direct): POSITIVE — AB

## 2013-07-07 MED ORDER — PENICILLIN G BENZATHINE 1200000 UNIT/2ML IM SUSP
1.2000 10*6.[IU] | Freq: Once | INTRAMUSCULAR | Status: AC
  Administered 2013-07-07: 1.2 10*6.[IU] via INTRAMUSCULAR
  Filled 2013-07-07: qty 2

## 2013-07-07 MED ORDER — ACETAMINOPHEN 325 MG PO TABS
650.0000 mg | ORAL_TABLET | Freq: Once | ORAL | Status: AC
  Administered 2013-07-07: 650 mg via ORAL
  Filled 2013-07-07: qty 2

## 2013-07-07 MED ORDER — MAGIC MOUTHWASH
5.0000 mL | Freq: Once | ORAL | Status: AC
  Administered 2013-07-07: 5 mL via ORAL
  Filled 2013-07-07: qty 5

## 2013-07-07 MED ORDER — HYDROCODONE-HOMATROPINE 5-1.5 MG/5ML PO SYRP: 5.0000 mL | ORAL_SOLUTION | Freq: Four times a day (QID) | ORAL | Status: DC | PRN

## 2013-07-07 NOTE — ED Provider Notes (Signed)
CSN: [id number]     Arrival date & time 07/07/13  1818 History  This chart was scribed for non-physician practitioner Fayrene Helper, PA-C, working with Suzi Roots, MD by Dorothey Baseman, ED Scribe. This patient was seen in room WTR9/WTR9 and the patient's care was started at 6:46 PM.    Chief Complaint  Patient presents with  . Sore Throat   The history is provided by the patient. No language interpreter was used.   HPI Comments: [name] is a 33 y.o. female who presents to the Emergency Department complaining of a constant sore throat with associated left ear pain, fatigue, diffuse myalgias, and subjective fever (99.9 measured in the ED) onset 3 days ago that she states has been progressively worsening. Patient states that there are now "white spots" on the back of her throat and has had some emesis. She states that she has been eating cold foods and using warm salt water gargles without relief. She reports taking Advil at home with mild, temporary relief of the fever. She denies cough, sneezing, diarrhea, chest pain, shortness of breath, abdominal pain. Patient states that she has been exposed to sick contacts. Patient has no other pertinent medical history.   Past Medical History  Diagnosis Date  . LGSIL (low grade squamous intraepithelial lesion) on Pap smear 02/14/2009    CIN 1/VAIN-1, HPV detected  . High risk HPV infection 01/2009   Past Surgical History  Procedure Laterality Date  . Colposcopy  2010    C & B   Family History  Problem Relation Age of Onset  . Diabetes Father   . Diabetes Maternal Grandmother    History  Substance Use Topics  . Smoking status: Never Smoker   . Smokeless tobacco: Never Used  . Alcohol Use: No     Comment: very rarely   OB History   Grav Para Term Preterm Abortions TAB SAB Ect Mult Living   2 1   1 1    1      Review of Systems  Constitutional: Positive for fever (subjective) and fatigue.  HENT: Positive for ear pain and sore throat.  Negative for sneezing.   Respiratory: Negative for cough and shortness of breath.   Cardiovascular: Negative for chest pain.  Gastrointestinal: Positive for vomiting. Negative for abdominal pain and diarrhea.  Musculoskeletal: Positive for myalgias.    Allergies  Review of patient's allergies indicates no known allergies.  Home Medications   Current Outpatient Rx  Name  Route  Sig  Dispense  Refill  . medroxyPROGESTERone (DEPO-PROVERA) 150 MG/ML injection   Intramuscular   Inject 1 mL (150 mg total) into the muscle once.   1 mL   4   . valACYclovir (VALTREX) 500 MG tablet      Take twice daily for 3-5 daily if needed   30 tablet   12    Triage Vitals: BP 118/73  Pulse 115  Temp(Src) 99.9 F (37.7 C) (Oral)  Resp 20  SpO2 96%  Physical Exam  Nursing note and vitals reviewed. Constitutional: She is oriented to person, place, and time. She appears well-developed and well-nourished. No distress.  HENT:  Head: Normocephalic and atraumatic.  Right Ear: Hearing, tympanic membrane, external ear and ear canal normal.  Left Ear: Hearing, tympanic membrane, external ear and ear canal normal.  Nose: Nose normal.  Mouth/Throat: Oropharyngeal exudate present.  Uvula is midline. Post-oropharygneal erythema. Bilateral tonsillar enlargement with exudates. No trismus.  Eyes: Conjunctivae are normal.  Neck: Normal  range of motion. Neck supple. No tracheal deviation present.  Anterior cervical lymphadenopathy. Trachea is midline.   Cardiovascular: Normal rate, regular rhythm and normal heart sounds.   Pulmonary/Chest: Effort normal and breath sounds normal. No respiratory distress.  Abdominal: Soft. She exhibits no distension. There is no tenderness.  No splenomegaly.   Musculoskeletal: Normal range of motion.  Neurological: She is alert and oriented to person, place, and time.  Skin: Skin is warm and dry.  Psychiatric: She has a normal mood and affect. Her behavior is normal.     ED Course  Procedures (including critical care time)  DIAGNOSTIC STUDIES: Oxygen Saturation is 96% on room air, normal by my interpretation.    COORDINATION OF CARE: 6:50 PM- Ordered a strep test. Discussed treatment plan with patient at bedside and patient verbalized agreement.   7:10 PM- Discussed that strep test results were positive. Will order an injection of antibiotics to treat. Discussed treatment plan with patient at bedside and patient verbalized agreement.   Labs Review Labs Reviewed  RAPID STREP SCREEN - Abnormal; Notable for the following:    Streptococcus, Group A Screen (Direct) POSITIVE (*)    All other components within normal limits   Imaging Review No results found.  EKG Interpretation   None       MDM   1. Strep pharyngitis    BP 118/73  Pulse 115  Temp(Src) 99.9 F (37.7 C) (Oral)  Resp 20  SpO2 96%   I personally performed the services described in this documentation, which was scribed in my presence. The recorded information has been reviewed and is accurate.      Fayrene HelperBowie Clarene Curran, PA-C 07/07/13 [phone]

## 2013-07-07 NOTE — Discharge Instructions (Signed)
Strep Throat  Strep throat is an infection of the throat caused by a bacteria named Streptococcus pyogenes. Your caregiver may call the infection streptococcal "tonsillitis" or "pharyngitis" depending on whether there are signs of inflammation in the tonsils or back of the throat. Strep throat is most common in children aged 33 33 years during the cold months of the year, but it can occur in people of any age during any season. This infection is spread from person to person (contagious) through coughing, sneezing, or other close contact.  SYMPTOMS   · Fever or chills.  · Painful, swollen, red tonsils or throat.  · Pain or difficulty when swallowing.  · White or yellow spots on the tonsils or throat.  · Swollen, tender lymph nodes or "glands" of the neck or under the jaw.  · Red rash all over the body (rare).  DIAGNOSIS   Many different infections can cause the same symptoms. A test must be done to confirm the diagnosis so the right treatment can be given. A "rapid strep test" can help your caregiver make the diagnosis in a few minutes. If this test is not available, a light swab of the infected area can be used for a throat culture test. If a throat culture test is done, results are usually available in a day or two.  TREATMENT   Strep throat is treated with antibiotic medicine.  HOME CARE INSTRUCTIONS   · Gargle with 1 tsp of salt in 1 cup of warm water, 3 4 times per day or as needed for comfort.  · Family members who also have a sore throat or fever should be tested for strep throat and treated with antibiotics if they have the strep infection.  · Make sure everyone in your household washes their hands well.  · Do not share food, drinking cups, or personal items that could cause the infection to spread to others.  · You may need to eat a soft food diet until your sore throat gets better.  · Drink enough water and fluids to keep your urine clear or pale yellow. This will help prevent dehydration.  · Get plenty of  rest.  · Stay home from school, daycare, or work until you have been on antibiotics for 24 hours.  · Only take over-the-counter or prescription medicines for pain, discomfort, or fever as directed by your caregiver.  · If antibiotics are prescribed, take them as directed. Finish them even if you start to feel better.  SEEK MEDICAL CARE IF:   · The glands in your neck continue to enlarge.  · You develop a rash, cough, or earache.  · You cough up green, yellow-brown, or bloody sputum.  · You have pain or discomfort not controlled by medicines.  · Your problems seem to be getting worse rather than better.  SEEK IMMEDIATE MEDICAL CARE IF:   · You develop any new symptoms such as vomiting, severe headache, stiff or painful neck, chest pain, shortness of breath, or trouble swallowing.  · You develop severe throat pain, drooling, or changes in your voice.  · You develop swelling of the neck, or the skin on the neck becomes red and tender.  · You have a fever.  · You develop signs of dehydration, such as fatigue, dry mouth, and decreased urination.  · You become increasingly sleepy, or you cannot wake up completely.  Document Released: 06/11/2000 Document Revised: 05/31/2012 Document Reviewed: 08/13/2010  ExitCare® Patient Information ©2014 [employer].

## 2013-07-07 NOTE — ED Notes (Signed)
Pt c/o sore throat and fever. Symptoms for 3 days. Getting worse now. Pt states she has an earache also. Pt states there are "white spots" on the back of her throat. Pt denies other symptoms. Pt with no acute distress.

## 2013-07-08 NOTE — ED Provider Notes (Signed)
Medical screening examination/treatment/procedure(s) were conducted as a shared visit with non-physician practitioner(s) and myself.  I personally evaluated the patient during the encounter.  EKG Interpretation   None       Pt c/o sore throat, bilat. No unilateral throat pain or swelling. No abscess. Strep pos.   Samantha RootsKevin E Logyn Dedominicis, MD 07/08/13 [phone]

## 2013-12-14 ENCOUNTER — Other Ambulatory Visit: Payer: Self-pay | Admitting: Women's Health

## 2014-01-21 ENCOUNTER — Telehealth: Payer: Self-pay | Admitting: *Deleted

## 2014-01-21 MED ORDER — VALACYCLOVIR HCL 500 MG PO TABS: ORAL_TABLET | ORAL | Status: DC

## 2014-01-21 NOTE — Telephone Encounter (Signed)
Pt called requesting refill on valtrex 500 mg for HSV, pt annual scheduled on 02/14/14. rx sent with 0 refills

## 2014-02-14 ENCOUNTER — Encounter: Payer: 59 | Admitting: Women's Health

## 2014-04-29 ENCOUNTER — Encounter (HOSPITAL_COMMUNITY): Payer: Self-pay | Admitting: Emergency Medicine

## 2014-06-27 ENCOUNTER — Encounter (HOSPITAL_COMMUNITY): Payer: Self-pay | Admitting: Emergency Medicine

## 2014-06-27 ENCOUNTER — Emergency Department (HOSPITAL_COMMUNITY)
Admission: EM | Admit: 2014-06-27 | Discharge: 2014-06-27 | Disposition: A | Discharge status: Home / Self Care | Payer: 59 | Attending: Emergency Medicine | Admitting: Emergency Medicine

## 2014-06-27 DIAGNOSIS — R112 Nausea with vomiting, unspecified: Secondary | ICD-10-CM | POA: Insufficient documentation

## 2014-06-27 DIAGNOSIS — R1031 Right lower quadrant pain: Secondary | ICD-10-CM

## 2014-06-27 DIAGNOSIS — R42 Dizziness and giddiness: Secondary | ICD-10-CM | POA: Insufficient documentation

## 2014-06-27 DIAGNOSIS — Z8619 Personal history of other infectious and parasitic diseases: Secondary | ICD-10-CM | POA: Insufficient documentation

## 2014-06-27 DIAGNOSIS — R1032 Left lower quadrant pain: Secondary | ICD-10-CM | POA: Diagnosis not present

## 2014-06-27 LAB — HEPATIC FUNCTION PANEL
ALK PHOS: 48 U/L (ref 39–117)
ALT: 16 U/L (ref 0–35)
AST: 24 U/L (ref 0–37)
Albumin: 4.2 g/dL (ref 3.5–5.2)
Bilirubin, Direct: 0.2 mg/dL (ref 0.0–0.3)
Indirect Bilirubin: 0.8 mg/dL (ref 0.3–0.9)
TOTAL PROTEIN: 7.8 g/dL (ref 6.0–8.3)
Total Bilirubin: 1 mg/dL (ref 0.3–1.2)

## 2014-06-27 LAB — CBC WITH DIFFERENTIAL/PLATELET
Basophils Absolute: 0 10*3/uL (ref 0.0–0.1)
Basophils Relative: 0 % (ref 0–1)
Eosinophils Absolute: 0.2 10*3/uL (ref 0.0–0.7)
Eosinophils Relative: 2 % (ref 0–5)
HCT: 39.5 % (ref 36.0–46.0)
Hemoglobin: 12.9 g/dL (ref 12.0–15.0)
LYMPHS PCT: 22 % (ref 12–46)
Lymphs Abs: 2 10*3/uL (ref 0.7–4.0)
MCH: 25.2 pg — ABNORMAL LOW (ref 26.0–34.0)
MCHC: 32.7 g/dL (ref 30.0–36.0)
MCV: 77.3 fL — ABNORMAL LOW (ref 78.0–100.0)
Monocytes Absolute: 0.5 10*3/uL (ref 0.1–1.0)
Monocytes Relative: 5 % (ref 3–12)
NEUTROS PCT: 71 % (ref 43–77)
Neutro Abs: 6.4 10*3/uL (ref 1.7–7.7)
PLATELETS: 248 10*3/uL (ref 150–400)
RBC: 5.11 MIL/uL (ref 3.87–5.11)
RDW: 14 % (ref 11.5–15.5)
WBC: 9.1 10*3/uL (ref 4.0–10.5)

## 2014-06-27 LAB — LIPASE, BLOOD: LIPASE: 18 U/L (ref 11–59)

## 2014-06-27 LAB — I-STAT BETA HCG BLOOD, ED (MC, WL, AP ONLY): I-stat hCG, quantitative: <5 m[IU]/mL (ref <5)

## 2014-06-27 LAB — URINALYSIS, ROUTINE W REFLEX MICROSCOPIC
Bilirubin Urine: NEGATIVE
GLUCOSE, UA: NEGATIVE mg/dL
Hgb urine dipstick: NEGATIVE
Ketones, ur: NEGATIVE mg/dL
LEUKOCYTES UA: NEGATIVE
NITRITE: NEGATIVE
PH: 6.5 (ref 5.0–8.0)
PROTEIN: NEGATIVE mg/dL
Specific Gravity, Urine: 1.003 — ABNORMAL LOW (ref 1.005–1.030)
Urobilinogen, UA: 0.2 mg/dL (ref 0.0–1.0)

## 2014-06-27 LAB — BASIC METABOLIC PANEL
Anion gap: 7 (ref 5–15)
BUN: 8 mg/dL (ref 6–23)
CO2: 23 mmol/L (ref 19–32)
Calcium: 8.6 mg/dL (ref 8.4–10.5)
Chloride: 105 mEq/L (ref 96–112)
Creatinine, Ser: 0.91 mg/dL (ref 0.50–1.10)
GFR calc Af Amer: >90 mL/min (ref >90)
GFR calc non Af Amer: 82 mL/min — ABNORMAL LOW (ref >90)
GLUCOSE: 95 mg/dL (ref 70–99)
POTASSIUM: 3.7 mmol/L (ref 3.5–5.1)
SODIUM: 135 mmol/L (ref 135–145)

## 2014-06-27 MED ORDER — SODIUM CHLORIDE 0.9 % IV BOLUS (SEPSIS)
1000.0000 mL | Freq: Once | INTRAVENOUS | Status: AC
  Administered 2014-06-27: 1000 mL via INTRAVENOUS

## 2014-06-27 MED ORDER — PROMETHAZINE HCL 25 MG PO TABS: 25.0000 mg | ORAL_TABLET | Freq: Four times a day (QID) | ORAL | Status: DC | PRN

## 2014-06-27 NOTE — Discharge Instructions (Signed)
For pain control please take Ibuprofen (also known as Motrin or Advil) 400mg  (this is normally 2 over the counter pills) every 6 hours. Take with food to minimize stomach irritation.  Please follow with your primary care doctor in the next 2 days for a check-up. They must obtain records for further management.   Do not hesitate to return to the Emergency Department for any new, worsening or concerning symptoms.     Abdominal Pain, Women Abdominal (stomach, pelvic, or belly) pain can be caused by many things. It is important to tell your doctor:  The location of the pain.  Does it come and go or is it present all the time?  Are there things that start the pain (eating certain foods, exercise)?  Are there other symptoms associated with the pain (fever, nausea, vomiting, diarrhea)? All of this is helpful to know when trying to find the cause of the pain. CAUSES   Stomach: virus or bacteria infection, or ulcer.  Intestine: appendicitis (inflamed appendix), regional ileitis (Crohn's disease), ulcerative colitis (inflamed colon), irritable bowel syndrome, diverticulitis (inflamed diverticulum of the colon), or cancer of the stomach or intestine.  Gallbladder disease or stones in the gallbladder.  Kidney disease, kidney stones, or infection.  Pancreas infection or cancer.  Fibromyalgia (pain disorder).  Diseases of the female organs:  Uterus: fibroid (non-cancerous) tumors or infection.  Fallopian tubes: infection or tubal pregnancy.  Ovary: cysts or tumors.  Pelvic adhesions (scar tissue).  Endometriosis (uterus lining tissue growing in the pelvis and on the pelvic organs).  Pelvic congestion syndrome (female organs filling up with blood just before the menstrual period).  Pain with the menstrual period.  Pain with ovulation (producing an egg).  Pain with an IUD (intrauterine device, birth control) in the uterus.  Cancer of the female organs.  Functional pain (pain  not caused by a disease, may improve without treatment).  Psychological pain.  Depression. DIAGNOSIS  Your doctor will decide the seriousness of your pain by doing an examination.  Blood tests.  X-rays.  Ultrasound.  CT scan (computed tomography, special type of X-ray).  MRI (magnetic resonance imaging).  Cultures, for infection.  Barium enema (dye inserted in the large intestine, to better view it with X-rays).  Colonoscopy (looking in intestine with a lighted tube).  Laparoscopy (minor surgery, looking in abdomen with a lighted tube).  Major abdominal exploratory surgery (looking in abdomen with a large incision). TREATMENT  The treatment will depend on the cause of the pain.   Many cases can be observed and treated at home.  Over-the-counter medicines recommended by your caregiver.  Prescription medicine.  Antibiotics, for infection.  Birth control pills, for painful periods or for ovulation pain.  Hormone treatment, for endometriosis.  Nerve blocking injections.  Physical therapy.  Antidepressants.  Counseling with a psychologist or psychiatrist.  Minor or major surgery. HOME CARE INSTRUCTIONS   Do not take laxatives, unless directed by your caregiver.  Take over-the-counter pain medicine only if ordered by your caregiver. Do not take aspirin because it can cause an upset stomach or bleeding.  Try a clear liquid diet (broth or water) as ordered by your caregiver. Slowly move to a bland diet, as tolerated, if the pain is related to the stomach or intestine.  Have a thermometer and take your temperature several times a day, and record it.  Bed rest and sleep, if it helps the pain.  Avoid sexual intercourse, if it causes pain.  Avoid stressful situations.  Keep  your follow-up appointments and tests, as your caregiver orders.  If the pain does not go away with medicine or surgery, you may try:  Acupuncture.  Relaxation exercises (yoga,  meditation).  Group therapy.  Counseling. SEEK MEDICAL CARE IF:   You notice certain foods cause stomach pain.  Your home care treatment is not helping your pain.  You need stronger pain medicine.  You want your IUD removed.  You feel faint or lightheaded.  You develop nausea and vomiting.  You develop a rash.  You are having side effects or an allergy to your medicine. SEEK IMMEDIATE MEDICAL CARE IF:   Your pain does not go away or gets worse.  You have a fever.  Your pain is felt only in portions of the abdomen. The right side could possibly be appendicitis. The left lower portion of the abdomen could be colitis or diverticulitis.  You are passing blood in your stools (bright red or black tarry stools, with or without vomiting).  You have blood in your urine.  You develop chills, with or without a fever.  You pass out. MAKE SURE YOU:   Understand these instructions.  Will watch your condition.  Will get help right away if you are not doing well or get worse. Document Released: 04/11/2007 Document Revised: 10/29/2013 Document Reviewed: 05/01/2009 [facility] Patient Information 2015 [employer], [place]. This information is not intended to replace advice given to you by your health care provider. Make sure you discuss any questions you have with your health care provider.

## 2014-06-27 NOTE — ED Provider Notes (Signed)
CSN: [id number]     Arrival date & time 06/27/14  [id number] History   First MD Initiated Contact with Patient 06/27/14 [phone]     Chief Complaint  Patient presents with  . Emesis     (Consider location/radiation/quality/duration/timing/severity/associated sxs/prior Treatment) HPI   [name] is a 33 y.o. y.o. female complaining of 3 episodes of bilious emesis onset this morning associated with mild lower abdominal cramping for the last 3 days which she describes as typical for her premenstrual cramping. Patient thinks that she is dehydrated, she reports she is very thirsty. She denies any decreased urinary output. Denies diarrhea, sick contacts, fever, chills, cough. On review of systems she notes a lightheaded sensation when going from sitting to standing. Patient was able to take Tylenol for her menstrual cramps this morning and this was helpful. Review of systems she also notes a right-sided low back pain     Past Medical History  Diagnosis Date  . LGSIL (low grade squamous intraepithelial lesion) on Pap smear 02/14/2009    CIN 1/VAIN-1, HPV detected  . High risk HPV infection 01/2009   Past Surgical History  Procedure Laterality Date  . Colposcopy  2010    C & B   Family History  Problem Relation Age of Onset  . Diabetes Father   . Diabetes Maternal Grandmother    History  Substance Use Topics  . Smoking status: Never Smoker   . Smokeless tobacco: Never Used  . Alcohol Use: No     Comment: very rarely   OB History    Gravida Para Term Preterm AB TAB SAB Ectopic Multiple Living   2 1   1 1    1      Review of Systems  10 systems reviewed and found to be negative, except as noted in the HPI.  Allergies  Review of patient's allergies indicates no known allergies.  Home Medications   Prior to Admission medications   Medication Sig Start Date End Date Taking? Authorizing Provider  acetaminophen (TYLENOL) 500 MG tablet Take 1,000 mg by mouth every 6 (six) hours as needed  for mild pain or headache.   Yes Historical Provider, MD  Acetaminophen-Caff-Pyrilamine (MIDOL COMPLETE) 500-60-15 MG TABS Take 2 tablets by mouth every 6 (six) hours as needed (for pain).   Yes Historical Provider, MD  promethazine (PHENERGAN) 25 MG tablet Take 1 tablet (25 mg total) by mouth every 6 (six) hours as needed for nausea or vomiting. 06/27/14   Joni ReiningNicole Orin Eberwein, PA-C   BP 122/73 mmHg  Pulse 75  Temp(Src) 98.5 F (36.9 C) (Oral)  Resp 19  SpO2 100% Physical Exam  Constitutional: She is oriented to person, place, and time. She appears well-developed and well-nourished. No distress.  HENT:  Head: Normocephalic.  Mouth/Throat: Oropharynx is clear and moist.  Eyes: Conjunctivae and EOM are normal. Pupils are equal, round, and reactive to light.  Neck: Normal range of motion.  Cardiovascular: Normal rate, regular rhythm and intact distal pulses.   Pulmonary/Chest: Effort normal and breath sounds normal. No stridor. No respiratory distress. She has no wheezes. She has no rales. She exhibits no tenderness.  Abdominal: Soft. Bowel sounds are normal. She exhibits no distension and no mass. There is no tenderness. There is no rebound and no guarding.  Musculoskeletal: Normal range of motion.  Neurological: She is alert and oriented to person, place, and time.  Psychiatric: She has a normal mood and affect.  Nursing note and vitals reviewed.   ED Course  Procedures (including critical care time) Labs Review Labs Reviewed  CBC WITH DIFFERENTIAL - Abnormal; Notable for the following:    MCV 77.3 (*)    MCH 25.2 (*)    All other components within normal limits  BASIC METABOLIC PANEL - Abnormal; Notable for the following:    GFR calc non Af Amer 82 (*)    All other components within normal limits  URINALYSIS, ROUTINE W REFLEX MICROSCOPIC - Abnormal; Notable for the following:    Specific Gravity, Urine 1.003 (*)    All other components within normal limits  HEPATIC FUNCTION PANEL   LIPASE, BLOOD  I-STAT BETA HCG BLOOD, ED ([facility], [facility], [facility] ONLY)    Imaging Review No results found.   EKG Interpretation None      MDM   Final diagnoses:  Non-intractable vomiting with nausea, vomiting of unspecified type  Abdominal cramping, bilateral lower quadrant   Filed Vitals:   06/27/14 0857 06/27/14 0900 06/27/14 1117 06/27/14 1148  BP: 130/64  125/68 122/73  Pulse: 77  76 75  Temp:  98.5 F (36.9 C) 98.5 F (36.9 C)   TempSrc:  Oral Oral   Resp:  16 16 19   SpO2: 99%  100% 100%    Medications  sodium chloride 0.9 % bolus 1,000 mL (0 mLs Intravenous Stopped 06/27/14 1102)    [name] is a pleasant 33 y.o. female presenting with several episodes of emesis in addition to lower abdominal cramping which she states is typical for her premenstrual syndrome. Serial abdominal exams are benign. Patient reports subjective improvement after hydration. Urinalysis with no signs of concentration. Patient is tolerating by mouth and amenable to discharge.  Evaluation does not show pathology that would require ongoing emergent intervention or inpatient treatment. Pt is hemodynamically stable and mentating appropriately. Discussed findings and plan with patient/guardian, who agrees with care plan. All questions answered. Return precautions discussed and outpatient follow up given.   Discharge Medication List as of 06/27/2014 12:07 PM    START taking these medications   Details  promethazine (PHENERGAN) 25 MG tablet Take 1 tablet (25 mg total) by mouth every 6 (six) hours as needed for nausea or vomiting., Starting 06/27/2014, Until Discontinued, Print            [employer] Steel Corporationicole Kortni Hasten, PA-C 06/28/14 0801  Layla MawKristen N Ward, DO 06/29/14 1609

## 2014-06-27 NOTE — ED Notes (Addendum)
Pt c/o abdominal pain, bloating, lower back pain, polydipsia, and nausea x several days, emesis x 3 onset 0400 today. Pt states she began menstruation yesterday.

## 2014-07-25 ENCOUNTER — Ambulatory Visit (INDEPENDENT_AMBULATORY_CARE_PROVIDER_SITE_OTHER): Payer: 59 | Admitting: Women's Health

## 2014-07-25 ENCOUNTER — Encounter: Payer: Self-pay | Admitting: Women's Health

## 2014-07-25 VITALS — BP 120/78 | Ht 66.0 in | Wt 166.0 lb

## 2014-07-25 DIAGNOSIS — Z113 Encounter for screening for infections with a predominantly sexual mode of transmission: Secondary | ICD-10-CM

## 2014-07-25 DIAGNOSIS — Z01419 Encounter for gynecological examination (general) (routine) without abnormal findings: Secondary | ICD-10-CM

## 2014-07-25 DIAGNOSIS — Z3042 Encounter for surveillance of injectable contraceptive: Secondary | ICD-10-CM

## 2014-07-25 DIAGNOSIS — B009 Herpesviral infection, unspecified: Secondary | ICD-10-CM

## 2014-07-25 MED ORDER — MEDROXYPROGESTERONE ACETATE 150 MG/ML IM SUSP
150.0000 mg | Freq: Once | INTRAMUSCULAR | Status: AC
  Administered 2014-07-25: 150 mg via INTRAMUSCULAR

## 2014-07-25 MED ORDER — MEDROXYPROGESTERONE ACETATE 150 MG/ML IM SUSP: 150.0000 mg | INTRAMUSCULAR | Status: DC

## 2014-07-25 MED ORDER — VALACYCLOVIR HCL 500 MG PO TABS: 500.0000 mg | ORAL_TABLET | Freq: Two times a day (BID) | ORAL | Status: DC

## 2014-07-25 NOTE — Patient Instructions (Signed)

## 2014-07-25 NOTE — Progress Notes (Signed)
Samantha PoissonMelonie Hardy 11/01/1980 [id number]    History:    Presents for annual exam.  Amenorrheic on Depo-Provera. Not sexually active currently, requesting STD screen. 2010 LGSIL with normal Paps after. Positive HSV 11/2012 with rare outbreaks. Reports all normal labs at health screening at work.  Past medical history, past surgical history, family history and social history were all reviewed and documented in the EPIC chart. Works at [employer] for [employer]. [name] 8 doing well. Father diabetes.  ROS:  A ROS was performed and pertinent positives and negatives are included.  Exam:  Filed Vitals:   07/25/14 1538  BP: 120/78    General appearance:  Normal Thyroid:  Symmetrical, normal in size, without palpable masses or nodularity. Respiratory  Auscultation:  Clear without wheezing or rhonchi Cardiovascular  Auscultation:  Regular rate, without rubs, murmurs or gallops  Edema/varicosities:  Not grossly evident Abdominal  Soft,nontender, without masses, guarding or rebound.  Liver/spleen:  No organomegaly noted  Hernia:  None appreciated  Skin  Inspection:  Grossly normal   Breasts: Examined lying and sitting.     Right: Without masses, retractions, discharge or axillary adenopathy.     Left: Without masses, retractions, discharge or axillary adenopathy. Gentitourinary   Inguinal/mons:  Normal without inguinal adenopathy  External genitalia:  Normal  BUS/Urethra/Skene's glands:  Normal  Vagina:  Normal  Cervix:  Normal  Uterus:   normal in size, shape and contour.  Midline and mobile  Adnexa/parametria:     Rt: Without masses or tenderness.   Lt: Without masses or tenderness.  Anus and perineum: Normal  Digital rectal exam: Normal sphincter tone without palpated masses or tenderness  Assessment/Plan:  34 y.o. [name] G1P1 for annual exam with no complaints.  Amenorrheic on Depo-Provera 2010 LGSIL normal Paps after HSV rare outbreaks STD screen.  Plan: Depo-Provera  150 mg IM every 12 weeks prescription, proper use, reviewed importance of  calcium rich diet, vitamin D 1000 daily encouraged. SBE's, regular exercise reviewed. Condoms encouraged if sexually active. 06/2012 Pap normal with negative HR HPV. Pap, new screening guidelines reviewed. GC/Chlamydia, HIV, hep B, C, RPRHarrington Hardy.   [name] J WHNP, 5:02 PM 07/25/2014

## 2014-07-26 ENCOUNTER — Other Ambulatory Visit (HOSPITAL_COMMUNITY)
Admission: RE | Admit: 2014-07-26 | Discharge: 2014-07-26 | Disposition: A | Discharge status: Home / Self Care | Payer: 59 | Source: Ambulatory Visit | Attending: Gynecology | Admitting: Gynecology

## 2014-07-26 DIAGNOSIS — Z01419 Encounter for gynecological examination (general) (routine) without abnormal findings: Secondary | ICD-10-CM | POA: Insufficient documentation

## 2014-07-26 LAB — GC/CHLAMYDIA PROBE AMP
CT PROBE, AMP APTIMA: NEGATIVE
GC Probe RNA: NEGATIVE

## 2014-07-26 NOTE — Addendum Note (Signed)
Addended by: Kem ParkinsonBARNES, Adair Lemar on: 07/26/2014 08:28 AM   Modules accepted: Orders, SmartSet

## 2014-07-29 LAB — CYTOLOGY - PAP

## 2014-10-17 ENCOUNTER — Telehealth: Payer: Self-pay | Admitting: *Deleted

## 2014-10-17 NOTE — Telephone Encounter (Signed)
Pt called requesting refill on depo provera, I called pt back and told her refills at pharmacy already for pick up

## 2015-03-04 ENCOUNTER — Telehealth: Payer: Self-pay | Admitting: Gynecology

## 2015-03-04 ENCOUNTER — Telehealth: Payer: Self-pay | Admitting: *Deleted

## 2015-03-04 NOTE — Telephone Encounter (Signed)
As long as within 12 week window from her last shot.

## 2015-03-04 NOTE — Telephone Encounter (Signed)
(  You are back up MD) pt would like to proceed with Mirena IUD placement, was using depo provera for birth control, last shot in June. Since no cycle while on depo, okay to insert with no cycle? Please advise

## 2015-03-04 NOTE — Telephone Encounter (Signed)
03/04/15-Pt was advised today that her [facility] insurance will cover the MIrena for contraception at 100%, no copay. wl

## 2015-03-05 NOTE — Telephone Encounter (Signed)
Left message

## 2015-03-06 ENCOUNTER — Encounter: Payer: Self-pay | Admitting: Women's Health

## 2015-03-06 NOTE — Telephone Encounter (Signed)
Patient aware with the below last shot given on 01/23/15 she injects herself with depo. 12 week window from last shot would be Oct 13-Oct. 27. Will have front desk call pt to schedule. Patient is not sexually active.

## 2015-04-09 ENCOUNTER — Other Ambulatory Visit: Payer: Self-pay | Admitting: Women's Health

## 2015-04-16 ENCOUNTER — Other Ambulatory Visit: Payer: Self-pay | Admitting: Women's Health

## 2015-04-16 MED ORDER — MEGESTROL ACETATE 40 MG PO TABS: 40.0000 mg | ORAL_TABLET | Freq: Two times a day (BID) | ORAL | Status: DC

## 2015-04-18 ENCOUNTER — Ambulatory Visit (INDEPENDENT_AMBULATORY_CARE_PROVIDER_SITE_OTHER): Payer: 59 | Admitting: Gynecology

## 2015-04-18 ENCOUNTER — Encounter: Payer: Self-pay | Admitting: Gynecology

## 2015-04-18 VITALS — BP 124/76

## 2015-04-18 DIAGNOSIS — Z30430 Encounter for insertion of intrauterine contraceptive device: Secondary | ICD-10-CM | POA: Diagnosis not present

## 2015-04-18 HISTORY — PX: INTRAUTERINE DEVICE INSERTION: SHX323

## 2015-04-18 NOTE — Progress Notes (Signed)
Patient presents for Mirena IUD placement. She has read through the booklet, has no contraindications and signed the consent form.  She currently is within the 12 week window of her Depo-Provera. She is switching over because of ongoing irregular bleeding.  I reviewed the insertional process with her as well as the risks to include infection, either immediate or long-term, uterine perforation or migration requiring surgery to remove, other complications such as pain, hormonal side effects, infertility and possibility of failure with subsequent pregnancy.   Exam with Kim assistant Pelvic: External BUS vagina normal. Cervix normal. Uterus axial normal size shape contour midline mobile nontender. Adnexa without masses or tenderness.  Procedure: The cervix was cleansed with Betadine, anterior lip grasped with a single-tooth tenaculum, the uterus was sounded and a Mirena IUD was placed according to manufacturer's recommendations without difficulty. The strings were trimmed. The patient tolerated well and will follow up in one month for a postinsertional check.  Lot number:  [id number]    Dara LordsFONTAINE,Thaniel Coluccio P MD, 2:49 PM 04/18/2015

## 2015-04-18 NOTE — Patient Instructions (Signed)
Intrauterine Device Insertion Most often, an intrauterine device (IUD) is inserted into the uterus to prevent pregnancy. There are 2 types of IUDs available:  Copper IUD--This type of IUD creates an environment that is not favorable to sperm survival. The mechanism of action of the copper IUD is not known for certain. It can stay in place for 10 years.  Hormone IUD--This type of IUD contains the hormone progestin (synthetic progesterone). The progestin thickens the cervical mucus and prevents sperm from entering the uterus, and it also thins the uterine lining. There is no evidence that the hormone IUD prevents implantation. One hormone IUD can stay in place for up to 5 years, and a different hormone IUD can stay in place for up to 3 years. An IUD is the most cost-effective birth control if left in place for the full duration. It may be removed at any time. LET YOUR HEALTH CARE PROVIDER KNOW ABOUT:  Any allergies you have.  All medicines you are taking, including vitamins, herbs, eye drops, creams, and over-the-counter medicines.  Previous problems you or members of your family have had with the use of anesthetics.  Any blood disorders you have.  Previous surgeries you have had.  Possibility of pregnancy.  Medical conditions you have. RISKS AND COMPLICATIONS  Generally, intrauterine device insertion is a safe procedure. However, as with any procedure, complications can occur. Possible complications include:  Accidental puncture (perforation) of the uterus.  Accidental placement of the IUD either in the muscle layer of the uterus (myometrium) or outside the uterus. If this happens, the IUD can be found essentially floating around the bowels and must be taken out surgically.  The IUD may fall out of the uterus (expulsion). This is more common in women who have recently had a child.   Pregnancy in the fallopian tube (ectopic).  Pelvic inflammatory disease (PID), which is infection of  the uterus and fallopian tubes. The risk of PID is slightly increased in the first 20 days after the IUD is placed, but the overall risk is still very low. BEFORE THE PROCEDURE  Schedule the IUD insertion for when you will have your menstrual period or right after, to make sure you are not pregnant. Placement of the IUD is better tolerated shortly after a menstrual cycle.  You may need to take tests or be examined to make sure you are not pregnant.  You may be required to take a pregnancy test.  You may be required to get checked for sexually transmitted infections (STIs) prior to placement. Placing an IUD in someone who has an infection can make the infection worse.  You may be given a pain reliever to take 1 or 2 hours before the procedure.  An exam will be performed to determine the size and position of your uterus.  Ask your health care provider about changing or stopping your regular medicines. PROCEDURE   A tool (speculum) is placed in the vagina. This allows your health care provider to see the lower part of the uterus (cervix).  The cervix is prepped with a medicine that lowers the risk of infection.  You may be given a medicine to numb each side of the cervix (intracervical or paracervical block). This is used to block and control any discomfort with insertion.  A tool (uterine sound) is inserted into the uterus to determine the length of the uterine cavity and the direction the uterus may be tilted.  A slim instrument (IUD inserter) is inserted through the cervical   canal and into your uterus.  The IUD is placed in the uterine cavity and the insertion device is removed.  The nylon string that is attached to the IUD and used for eventual IUD removal is trimmed. It is trimmed so that it lays high in the vagina, just outside the cervix. AFTER THE PROCEDURE  You may have bleeding after the procedure. This is normal. It varies from light spotting for a few days to menstrual-like  bleeding.  You may have mild cramping.   This information is not intended to replace advice given to you by your health care provider. Make sure you discuss any questions you have with your health care provider.   Document Released: 02/10/2011 Document Revised: 04/04/2013 Document Reviewed: 12/03/2012 Elsevier Interactive Patient Education 2016 [employer] Inc[employer]  

## 2015-04-21 ENCOUNTER — Encounter: Payer: Self-pay | Admitting: Gynecology

## 2015-05-20 ENCOUNTER — Encounter: Payer: Self-pay | Admitting: Gynecology

## 2015-05-20 ENCOUNTER — Ambulatory Visit (INDEPENDENT_AMBULATORY_CARE_PROVIDER_SITE_OTHER): Payer: 59 | Admitting: Gynecology

## 2015-05-20 VITALS — BP 130/70

## 2015-05-20 DIAGNOSIS — Z30431 Encounter for routine checking of intrauterine contraceptive device: Secondary | ICD-10-CM

## 2015-05-20 NOTE — Patient Instructions (Signed)
Follow up in January/February for your annual exam when due. Sooner if any issues.

## 2015-05-20 NOTE — Progress Notes (Signed)
Samantha PoissonMelonie Hardy 04/06/1981 [id number]        34 y.o.  [id number] Presents for follow up IUD check. Doing well without complaints or bleeding.  Past medical history,surgical history, problem list, medications, allergies, family history and social history were all reviewed and documented in the EPIC chart.  Directed ROS with pertinent positives and negatives documented in the history of present illness/assessment and plan.  Exam: Kim assistant Filed Vitals:   05/20/15 1531  BP: 130/70   General appearance:  Normal Abdomen soft nontender without masses guarding rebound Pelvic external BUS vagina normal. Cervix normal. IUD string not initially visible. Uterus retroverted normal size midline mobile nontender. Adnexa without masses or tenderness.  Using the colposcope the IUD strings were visible right inside the external os.  Assessment/Plan:  34 y.o. G2P0011 with normal IUD follow up exam. Patient due for her annual exam January/February 2017 and will follow up for this, sooner if any issues    Dara LordsFONTAINE,Afshin Chrystal P MD, 3:42 PM 05/20/2015

## 2015-08-28 ENCOUNTER — Encounter (HOSPITAL_COMMUNITY): Payer: Self-pay | Admitting: Emergency Medicine

## 2015-08-28 ENCOUNTER — Emergency Department (HOSPITAL_COMMUNITY)
Admission: EM | Admit: 2015-08-28 | Discharge: 2015-08-28 | Disposition: A | Discharge status: Home / Self Care | Payer: 59 | Attending: Emergency Medicine | Admitting: Emergency Medicine

## 2015-08-28 DIAGNOSIS — Z8619 Personal history of other infectious and parasitic diseases: Secondary | ICD-10-CM | POA: Diagnosis not present

## 2015-08-28 DIAGNOSIS — H6693 Otitis media, unspecified, bilateral: Secondary | ICD-10-CM

## 2015-08-28 DIAGNOSIS — H65193 Other acute nonsuppurative otitis media, bilateral: Secondary | ICD-10-CM | POA: Insufficient documentation

## 2015-08-28 DIAGNOSIS — H9203 Otalgia, bilateral: Secondary | ICD-10-CM | POA: Diagnosis present

## 2015-08-28 MED ORDER — AMOXICILLIN 500 MG PO CAPS: 500.0000 mg | ORAL_CAPSULE | Freq: Two times a day (BID) | ORAL | Status: DC

## 2015-08-28 NOTE — ED Provider Notes (Signed)
CSN: [id number]     Arrival date & time 08/28/15  0807 History   First MD Initiated Contact with Patient 08/28/15 [phone]     Chief Complaint  Patient presents with  . Otitis Media     (Consider location/radiation/quality/duration/timing/severity/associated sxs/prior Treatment) HPI  Malonie Heimsoth is a 35 y.o F with no significant pmhx who presents to the ED c/o bilateral ear pain. Pt states that 2 days ago she began having R sided ear pain. Pain is now in both ears. Pt has associated subjective fevers, cough, and rhinorrhea. Pt has been taking tylenol with minimal relief. Denies neck pain/stiffness, mastoid tenderness, sore throat, productive cough, SOB.    Past Medical History  Diagnosis Date  . LGSIL (low grade squamous intraepithelial lesion) on Pap smear 02/14/2009    CIN 1/VAIN-1, HPV detected  . High risk HPV infection 01/2009   Past Surgical History  Procedure Laterality Date  . Colposcopy  2010    C & B  . Intrauterine device insertion  04/18/2015    Mirena   Family History  Problem Relation Age of Onset  . Diabetes Father   . Diabetes Maternal Grandmother    Social History  Substance Use Topics  . Smoking status: Never Smoker   . Smokeless tobacco: Never Used  . Alcohol Use: No     Comment: very rarely   OB History    Gravida Para Term Preterm AB TAB SAB Ectopic Multiple Living   Review of Systems  All other systems reviewed and are negative.     Allergies  Review of patient's allergies indicates no known allergies.  Home Medications   Prior to Admission medications   Medication Sig Start Date End Date Taking? Authorizing Provider  acetaminophen (TYLENOL) 500 MG tablet Take 1,000 mg by mouth every 6 (six) hours as needed for mild pain or headache.    Historical Provider, MD  levonorgestrel (MIRENA) 20 MCG/24HR IUD 1 each by Intrauterine route once.    Historical Provider, MD  valACYclovir (VALTREX) 500 MG tablet TAKE 1 TABLET BY MOUTH  TWICE DAILY FOR 3 TO 5 DAYS AS NEEDED 04/10/15   Harrington Challenger, NP   BP 130/85 mmHg  Pulse 116  Temp(Src) 98.5 F (36.9 C) (Oral)  Resp 16  SpO2 97% Physical Exam  Constitutional: She is oriented to person, place, and time. She appears well-developed and well-nourished. No distress.  HENT:  Head: Normocephalic and atraumatic.  Right Ear: Tympanic membrane is erythematous. A middle ear effusion is present.  Left Ear: Tympanic membrane is erythematous.  Mouth/Throat: Oropharynx is clear and moist. No oropharyngeal exudate.  Eyes: Conjunctivae and EOM are normal. Pupils are equal, round, and reactive to light. Right eye exhibits no discharge. Left eye exhibits no discharge. No scleral icterus.  Neck: Neck supple.  No meningismus.  Cardiovascular: Normal rate.   Pulmonary/Chest: Effort normal.  Lymphadenopathy:    She has no cervical adenopathy.  Neurological: She is alert and oriented to person, place, and time. Coordination normal.  Skin: Skin is warm and dry. No rash noted. She is not diaphoretic. No erythema. No pallor.  Psychiatric: She has a normal mood and affect. Her behavior is normal.  Nursing note and vitals reviewed.   ED Course  Procedures (including critical care time) Labs Review Labs Reviewed - No data to display  Imaging Review No results found. I have personally reviewed and evaluated these images  and lab results as part of my medical decision-making.   EKG Interpretation None      MDM   Final diagnoses:  Bilateral acute otitis media, recurrence not specified, unspecified otitis media type    Patient presents with otalgia and exam consistent with acute otitis media. No concern for acute mastoiditis, meningitis. No antibiotic use in the last month.  Patient discharged home with Amoxicillin.    Advised parents to call pediatrician today for follow-up.  I have also discussed reasons to return immediately to the ER.  Parent expresses understanding and agrees  with plan.       Lester Kinsman [place], PA-C 08/28/15 1610  Raeford Razor, MD 08/28/15 [phone]

## 2015-08-28 NOTE — Discharge Instructions (Signed)
Otitis Media, Adult Otitis media is redness, soreness, and puffiness (swelling) in the space just behind your eardrum (middle ear). It may be caused by allergies or infection. It often happens along with a cold. HOME CARE  Take your medicine as told. Finish it even if you start to feel better.  Only take over-the-counter or prescription medicines for pain, discomfort, or fever as told by your doctor.  Follow up with your doctor as told. GET HELP IF:  You have otitis media only in one ear, or bleeding from your nose, or both.  You notice a lump on your neck.  You are not getting better in 3-5 days.  You feel worse instead of better. GET HELP RIGHT AWAY IF:   You have pain that is not helped with medicine.  You have puffiness, redness, or pain around your ear.  You get a stiff neck.  You cannot move part of your face (paralysis).  You notice that the bone behind your ear hurts when you touch it. MAKE SURE YOU:   Understand these instructions.  Will watch your condition.  Will get help right away if you are not doing well or get worse.   This information is not intended to replace advice given to you by your health care provider. Make sure you discuss any questions you have with your health care provider.  Follow up with PCP if symptoms do not improve. Take antibiotics as prescribed. Return to the ED if you experience severe worsening of your symptoms, increased fever, neck pain/stiffness, pain in the bone behind your ear.

## 2015-08-28 NOTE — ED Notes (Signed)
Pt c/o headache bilateral ear pain onset 7 days ago after URI with cough, hoarseness and flu-like symptoms. TM intact, edematous, injected, erythematous bilaterally.

## 2015-08-29 ENCOUNTER — Encounter: Payer: 59 | Admitting: Women's Health

## 2015-10-20 ENCOUNTER — Other Ambulatory Visit: Payer: Self-pay | Admitting: Women's Health

## 2015-10-22 ENCOUNTER — Other Ambulatory Visit: Payer: Self-pay | Admitting: Women's Health

## 2015-11-12 ENCOUNTER — Telehealth: Payer: Self-pay | Admitting: *Deleted

## 2015-11-12 NOTE — Telephone Encounter (Signed)
(  you are back up MD) Pt has Mirena IUD doing well, started spotting this week going on vacation end of this week, and doesn't want to spot. Pt asked could she have a short Rx for megace to stop this? Please advise

## 2015-11-13 MED ORDER — MEGESTROL ACETATE 20 MG PO TABS: 20.0000 mg | ORAL_TABLET | Freq: Every day | ORAL | Status: DC

## 2015-11-13 NOTE — Telephone Encounter (Signed)
Pt aware, Rx sent. 

## 2015-11-13 NOTE — Telephone Encounter (Signed)
Okay for Megace 20 mg #10 one by mouth daily

## 2015-12-15 ENCOUNTER — Encounter: Payer: 59 | Admitting: Women's Health

## 2016-01-02 ENCOUNTER — Encounter: Payer: Self-pay | Admitting: Women's Health

## 2016-01-02 ENCOUNTER — Ambulatory Visit (INDEPENDENT_AMBULATORY_CARE_PROVIDER_SITE_OTHER): Payer: 59 | Admitting: Women's Health

## 2016-01-02 VITALS — BP 116/74 | Ht 66.75 in | Wt 186.0 lb

## 2016-01-02 DIAGNOSIS — Z975 Presence of (intrauterine) contraceptive device: Secondary | ICD-10-CM

## 2016-01-02 DIAGNOSIS — B009 Herpesviral infection, unspecified: Secondary | ICD-10-CM | POA: Diagnosis not present

## 2016-01-02 DIAGNOSIS — Z01419 Encounter for gynecological examination (general) (routine) without abnormal findings: Secondary | ICD-10-CM | POA: Diagnosis not present

## 2016-01-02 NOTE — Progress Notes (Signed)
Samantha PoissonMelonie Hardy 06/26/1981 [id number]    History:    Presents for annual exam.  Rare spotting on Mirena IUD placed 03/2015. Reports normal labs at health screening at work. 2010 LGSIL normal Paps after. Same partner with negative STD screen.  Past medical history, past surgical history, family history and social history were all reviewed and documented in the EPIC chart. 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 Currently working at [employer]. [name] 9  doing well.  ROS:  A ROS was performed and pertinent positives and negatives are included.  Exam:  Filed Vitals:   01/02/16 1612  BP: 116/74    General appearance:  Normal Thyroid:  Symmetrical, normal in size, without palpable masses or nodularity. Respiratory  Auscultation:  Clear without wheezing or rhonchi Cardiovascular  Auscultation:  Regular rate, without rubs, murmurs or gallops  Edema/varicosities:  Not grossly evident Abdominal  Soft,nontender, without masses, guarding or rebound.  Liver/spleen:  No organomegaly noted  Hernia:  None appreciated  Skin  Inspection:  Grossly normal   Breasts: Examined lying and sitting.     Right: Without masses, retractions, discharge or axillary adenopathy.     Left: Without masses, retractions, discharge or axillary adenopathy. Gentitourinary   Inguinal/mons:  Normal without inguinal adenopathy  External genitalia:  Normal  BUS/Urethra/Skene's glands:  Normal  Vagina:  Normal  Cervix:  Normal IUD strings visible in os  Uterus:  normal in size, shape and contour.  Midline and mobile  Adnexa/parametria:     Rt: Without masses or tenderness.   Lt: Without masses or tenderness.  Anus and perineum: Normal  Digital rectal exam: Normal sphincter tone without palpated masses or tenderness  Assessment/Plan:  35 y.o. SBF G1 P1 for annual exam with complaint of increased breast tenderness in the past few weeks.  03/2015 Mirena IUD amenorrhea, rare spotting HSV  rare outbreaks 2010 LGSIL with normal Paps after Mastodynia  Plan: Completing [employer] program has been drinking increased caffeinated beverages in the past few weeks, instructed to stop/decrease caffeine if continued breast tenderness instructed to call. Reviewed importance of increasing regular exercise, decreasing calories for weight loss, has gained 20 pounds in the past year. SBE's, annual mammogram at 40. Folic acid supplement encouraged. Declines need for refill on Valtrex. UA, Pap normal 2016, new screening guidelines reviewed.Marland Kitchen.    Harrington ChallengerYOUNG,[name] Rock[facility] Regional Medical CenterWHNP, 4:49 PM 01/02/2016

## 2016-01-02 NOTE — Patient Instructions (Signed)
Health Maintenance, Female Adopting a healthy lifestyle and getting preventive care can go a long way to promote health and wellness. Talk with your health care provider about what schedule of regular examinations is right for you. This is a good chance for you to check in with your provider about disease prevention and staying healthy. In between checkups, there are plenty of things you can do on your own. Experts have done a lot of research about which lifestyle changes and preventive measures are most likely to keep you healthy. Ask your health care provider for more information. WEIGHT AND DIET  Eat a healthy diet  Be sure to include plenty of vegetables, fruits, low-fat dairy products, and lean protein.  Do not eat a lot of foods high in solid fats, added sugars, or salt.  Get regular exercise. This is one of the most important things you can do for your health.  Most adults should exercise for at least 150 minutes each week. The exercise should increase your heart rate and make you sweat (moderate-intensity exercise).  Most adults should also do strengthening exercises at least twice a week. This is in addition to the moderate-intensity exercise.  Maintain a healthy weight  Body mass index (BMI) is a measurement that can be used to identify possible weight problems. It estimates body fat based on height and weight. Your health care provider can help determine your BMI and help you achieve or maintain a healthy weight.  For females 20 years of age and older:   A BMI below 18.5 is considered underweight.  A BMI of 18.5 to 24.9 is normal.  A BMI of 25 to 29.9 is considered overweight.  A BMI of 30 and above is considered obese.  Watch levels of cholesterol and blood lipids  You should start having your blood tested for lipids and cholesterol at 35 years of age, then have this test every 5 years.  You may need to have your cholesterol levels checked more often if:  Your lipid  or cholesterol levels are high.  You are older than 35 years of age.  You are at high risk for heart disease.  CANCER SCREENING   Lung Cancer  Lung cancer screening is recommended for adults 55-80 years old who are at high risk for lung cancer because of a history of smoking.  A yearly low-dose CT scan of the lungs is recommended for people who:  Currently smoke.  Have quit within the past 15 years.  Have at least a 30-pack-year history of smoking. A pack year is smoking an average of one pack of cigarettes a day for 1 year.  Yearly screening should continue until it has been 15 years since you quit.  Yearly screening should stop if you develop a health problem that would prevent you from having lung cancer treatment.  Breast Cancer  Practice breast self-awareness. This means understanding how your breasts normally appear and feel.  It also means doing regular breast self-exams. Let your health care provider know about any changes, no matter how small.  If you are in your 20s or 30s, you should have a clinical breast exam (CBE) by a health care provider every 1-3 years as part of a regular health exam.  If you are 40 or older, have a CBE every year. Also consider having a breast X-ray (mammogram) every year.  If you have a family history of breast cancer, talk to your health care provider about genetic screening.  If you   are at high risk for breast cancer, talk to your health care provider about having an MRI and a mammogram every year.  Breast cancer gene (BRCA) assessment is recommended for women who have family members with BRCA-related cancers. BRCA-related cancers include:  Breast.  Ovarian.  Tubal.  Peritoneal cancers.  Results of the assessment will determine the need for genetic counseling and BRCA1 and BRCA2 testing. Cervical Cancer Your health care provider may recommend that you be screened regularly for cancer of the pelvic organs (ovaries, uterus, and  vagina). This screening involves a pelvic examination, including checking for microscopic changes to the surface of your cervix (Pap test). You may be encouraged to have this screening done every 3 years, beginning at age 21.  For women ages 30-65, health care providers may recommend pelvic exams and Pap testing every 3 years, or they may recommend the Pap and pelvic exam, combined with testing for human papilloma virus (HPV), every 5 years. Some types of HPV increase your risk of cervical cancer. Testing for HPV may also be done on women of any age with unclear Pap test results.  Other health care providers may not recommend any screening for nonpregnant women who are considered low risk for pelvic cancer and who do not have symptoms. Ask your health care provider if a screening pelvic exam is right for you.  If you have had past treatment for cervical cancer or a condition that could lead to cancer, you need Pap tests and screening for cancer for at least 20 years after your treatment. If Pap tests have been discontinued, your risk factors (such as having a new sexual partner) need to be reassessed to determine if screening should resume. Some women have medical problems that increase the chance of getting cervical cancer. In these cases, your health care provider may recommend more frequent screening and Pap tests. Colorectal Cancer  This type of cancer can be detected and often prevented.  Routine colorectal cancer screening usually begins at 35 years of age and continues through 35 years of age.  Your health care provider may recommend screening at an earlier age if you have risk factors for colon cancer.  Your health care provider may also recommend using home test kits to check for hidden blood in the stool.  A small camera at the end of a tube can be used to examine your colon directly (sigmoidoscopy or colonoscopy). This is done to check for the earliest forms of colorectal  cancer.  Routine screening usually begins at age 50.  Direct examination of the colon should be repeated every 5-10 years through 35 years of age. However, you may need to be screened more often if early forms of precancerous polyps or small growths are found. Skin Cancer  Check your skin from head to toe regularly.  Tell your health care provider about any new moles or changes in moles, especially if there is a change in a mole's shape or color.  Also tell your health care provider if you have a mole that is larger than the size of a pencil eraser.  Always use sunscreen. Apply sunscreen liberally and repeatedly throughout the day.  Protect yourself by wearing long sleeves, pants, a wide-brimmed hat, and sunglasses whenever you are outside. HEART DISEASE, DIABETES, AND HIGH BLOOD PRESSURE   High blood pressure causes heart disease and increases the risk of stroke. High blood pressure is more likely to develop in:  People who have blood pressure in the high end   of the normal range (130-139/85-89 mm Hg).  People who are overweight or obese.  People who are African American.  If you are 38-23 years of age, have your blood pressure checked every 3-5 years. If you are 61 years of age or older, have your blood pressure checked every year. You should have your blood pressure measured twice--once when you are at a hospital or clinic, and once when you are not at a hospital or clinic. Record the average of the two measurements. To check your blood pressure when you are not at a hospital or clinic, you can use:  An automated blood pressure machine at a pharmacy.  A home blood pressure monitor.  If you are between 45 years and 39 years old, ask your health care provider if you should take aspirin to prevent strokes.  Have regular diabetes screenings. This involves taking a blood sample to check your fasting blood sugar level.  If you are at a normal weight and have a low risk for diabetes,  have this test once every three years after 35 years of age.  If you are overweight and have a high risk for diabetes, consider being tested at a younger age or more often. PREVENTING INFECTION  Hepatitis B  If you have a higher risk for hepatitis B, you should be screened for this virus. You are considered at high risk for hepatitis B if:  You were born in a country where hepatitis B is common. Ask your health care provider which countries are considered high risk.  Your parents were born in a high-risk country, and you have not been immunized against hepatitis B (hepatitis B vaccine).  You have HIV or AIDS.  You use needles to inject street drugs.  You live with someone who has hepatitis B.  You have had sex with someone who has hepatitis B.  You get hemodialysis treatment.  You take certain medicines for conditions, including cancer, organ transplantation, and autoimmune conditions. Hepatitis C  Blood testing is recommended for:  Everyone born from [birth year] through 1965.  Anyone with known risk factors for hepatitis C. Sexually transmitted infections (STIs)  You should be screened for sexually transmitted infections (STIs) including gonorrhea and chlamydia if:  You are sexually active and are younger than 35 years of age.  You are older than 35 years of age and your health care provider tells you that you are at risk for this type of infection.  Your sexual activity has changed since you were last screened and you are at an increased risk for chlamydia or gonorrhea. Ask your health care provider if you are at risk.  If you do not have HIV, but are at risk, it may be recommended that you take a prescription medicine daily to prevent HIV infection. This is called pre-exposure prophylaxis (PrEP). You are considered at risk if:  You are sexually active and do not regularly use condoms or know the HIV status of your partner(s).  You take drugs by injection.  You are sexually  active with a partner who has HIV. Talk with your health care provider about whether you are at high risk of being infected with HIV. If you choose to begin PrEP, you should first be tested for HIV. You should then be tested every 3 months for as long as you are taking PrEP.  PREGNANCY   If you are premenopausal and you may become pregnant, ask your health care provider about preconception counseling.  If you may  become pregnant, take 400 to 800 micrograms (mcg) of folic acid every day.  If you want to prevent pregnancy, talk to your health care provider about birth control (contraception). OSTEOPOROSIS AND MENOPAUSE   Osteoporosis is a disease in which the bones lose minerals and strength with aging. This can result in serious bone fractures. Your risk for osteoporosis can be identified using a bone density scan.  If you are 42 years of age or older, or if you are at risk for osteoporosis and fractures, ask your health care provider if you should be screened.  Ask your health care provider whether you should take a calcium or vitamin D supplement to lower your risk for osteoporosis.  Menopause may have certain physical symptoms and risks.  Hormone replacement therapy may reduce some of these symptoms and risks. Talk to your health care provider about whether hormone replacement therapy is right for you.  HOME CARE INSTRUCTIONS   Schedule regular health, dental, and eye exams.  Stay current with your immunizations.   Do not use any tobacco products including cigarettes, chewing tobacco, or electronic cigarettes.  If you are pregnant, do not drink alcohol.  If you are breastfeeding, limit how much and how often you drink alcohol.  Limit alcohol intake to no more than 1 drink per day for nonpregnant women. One drink equals 12 ounces of beer, 5 ounces of wine, or 1 ounces of hard liquor.  Do not use street drugs.  Do not share needles.  Ask your health care provider for help if  you need support or information about quitting drugs.  Tell your health care provider if you often feel depressed.  Tell your health care provider if you have ever been abused or do not feel safe at home.   This information is not intended to replace advice given to you by your health care provider. Make sure you discuss any questions you have with your health care provider.   Document Released: 12/28/2010 Document Revised: 07/05/2014 Document Reviewed: 05/16/2013 Elsevier Interactive Patient Education 2016 [place] Carbohydrate Counting for Diabetes Mellitus Carbohydrate counting is a method for keeping track of the amount of carbohydrates you eat. Eating carbohydrates naturally increases the level of sugar (glucose) in your blood, so it is important for you to know the amount that is okay for you to have in every meal. Carbohydrate counting helps keep the level of glucose in your blood within normal limits. The amount of carbohydrates allowed is different for every person. A dietitian can help you calculate the amount that is right for you. Once you know the amount of carbohydrates you can have, you can count the carbohydrates in the foods you want to eat. Carbohydrates are found in the following foods:  Grains, such as breads and cereals.  Dried beans and soy products.  Starchy vegetables, such as potatoes, peas, and corn.  Fruit and fruit juices.  Milk and yogurt.  Sweets and snack foods, such as cake, cookies, candy, chips, soft drinks, and fruit drinks. CARBOHYDRATE COUNTING There are two ways to count the carbohydrates in your food. You can use either of the methods or a combination of both. Reading the "Nutrition Facts" on [place] The "Nutrition Facts" is an area that is included on the labels of almost all packaged food and beverages in the [place]. It includes the serving size of that food or beverage and information about the nutrients in each serving of  the food, including the grams (g)  of carbohydrate per serving.  Decide the number of servings of this food or beverage that you will be able to eat or drink. Multiply that number of servings by the number of grams of carbohydrate that is listed on the label for that serving. The total will be the amount of carbohydrates you will be having when you eat or drink this food or beverage. Learning Standard Serving Sizes of Food When you eat food that is not packaged or does not include "Nutrition Facts" on the label, you need to measure the servings in order to count the amount of carbohydrates.A serving of most carbohydrate-rich foods contains about 15 g of carbohydrates. The following list includes serving sizes of carbohydrate-rich foods that provide 15 g ofcarbohydrate per serving:   1 slice of bread (1 oz) or 1 six-inch tortilla.    of a hamburger bun or English muffin.  4-6 crackers.   cup unsweetened dry cereal.    cup hot cereal.   cup rice or pasta.    cup mashed potatoes or  of a large baked potato.  1 cup fresh fruit or one small piece of fruit.    cup canned or frozen fruit or fruit juice.  1 cup milk.   cup plain fat-free yogurt or yogurt sweetened with artificial sweeteners.   cup cooked dried beans or starchy vegetable, such as peas, corn, or potatoes.  Decide the number of standard-size servings that you will eat. Multiply that number of servings by 15 (the grams of carbohydrates in that serving). For example, if you eat 2 cups of strawberries, you will have eaten 2 servings and 30 g of carbohydrates (2 servings x 15 g = 30 g). For foods such as soups and casseroles, in which more than one food is mixed in, you will need to count the carbohydrates in each food that is included. EXAMPLE OF CARBOHYDRATE COUNTING Sample Dinner  3 oz chicken breast.   cup of brown rice.   cup of corn.  1 cup milk.   1 cup strawberries with sugar-free whipped topping.   Carbohydrate Calculation Step 1: Identify the foods that contain carbohydrates:   Rice.   Corn.   Milk.   Strawberries. Step 2:Calculate the number of servings eaten of each:   2 servings of rice.   1 serving of corn.   1 serving of milk.   1 serving of strawberries. Step 3: Multiply each of those number of servings by 15 g:   2 servings of rice x 15 g = 30 g.   1 serving of corn x 15 g = 15 g.   1 serving of milk x 15 g = 15 g.   1 serving of strawberries x 15 g = 15 g. Step 4: Add together all of the amounts to find the total grams of carbohydrates eaten: 30 g + 15 g + 15 g + 15 g = 75 g.   This information is not intended to replace advice given to you by your health care provider. Make sure you discuss any questions you have with your health care provider.   Document Released: 06/14/2005 Document Revised: 07/05/2014 Document Reviewed: 05/11/2013 Elsevier Interactive Patient Education [employer].

## 2016-01-05 ENCOUNTER — Other Ambulatory Visit: Payer: Self-pay | Admitting: Women's Health

## 2016-01-05 LAB — URINALYSIS W MICROSCOPIC + REFLEX CULTURE
BILIRUBIN URINE: NEGATIVE
CRYSTALS: NONE SEEN [HPF]
Casts: NONE SEEN [LPF]
Glucose, UA: NEGATIVE
Hgb urine dipstick: NEGATIVE
Ketones, ur: NEGATIVE
Leukocytes, UA: NEGATIVE
Nitrite: POSITIVE — AB
Protein, ur: NEGATIVE
RBC / HPF: NONE SEEN RBC/HPF (ref <=2)
SPECIFIC GRAVITY, URINE: 1.017 (ref 1.001–1.035)
Squamous Epithelial / LPF: NONE SEEN [HPF] (ref <=5)
WBC UA: NONE SEEN WBC/HPF (ref <=5)
Yeast: NONE SEEN [HPF]
pH: 6.5 (ref 5.0–8.0)

## 2016-01-08 LAB — URINE CULTURE

## 2016-01-09 ENCOUNTER — Other Ambulatory Visit: Payer: Self-pay | Admitting: Women's Health

## 2016-01-09 DIAGNOSIS — N3000 Acute cystitis without hematuria: Secondary | ICD-10-CM

## 2016-01-09 MED ORDER — SULFAMETHOXAZOLE-TRIMETHOPRIM 800-160 MG PO TABS: 1.0000 | ORAL_TABLET | Freq: Two times a day (BID) | ORAL | Status: DC

## 2016-03-26 ENCOUNTER — Other Ambulatory Visit: Payer: Self-pay | Admitting: Gynecology

## 2016-04-13 ENCOUNTER — Other Ambulatory Visit: Payer: Self-pay | Admitting: Women's Health

## 2016-11-10 ENCOUNTER — Encounter: Payer: Self-pay | Admitting: Gynecology

## 2017-02-14 ENCOUNTER — Encounter: Payer: Self-pay | Admitting: Women's Health

## 2017-02-14 ENCOUNTER — Ambulatory Visit (INDEPENDENT_AMBULATORY_CARE_PROVIDER_SITE_OTHER): Payer: Commercial Managed Care - PPO | Admitting: Women's Health

## 2017-02-14 VITALS — BP 128/80 | Ht 66.0 in | Wt 178.0 lb

## 2017-02-14 DIAGNOSIS — R635 Abnormal weight gain: Secondary | ICD-10-CM | POA: Diagnosis not present

## 2017-02-14 DIAGNOSIS — A609 Anogenital herpesviral infection, unspecified: Secondary | ICD-10-CM | POA: Diagnosis not present

## 2017-02-14 DIAGNOSIS — Z01419 Encounter for gynecological examination (general) (routine) without abnormal findings: Secondary | ICD-10-CM

## 2017-02-14 LAB — CBC WITH DIFFERENTIAL/PLATELET
BASOS PCT: 0 %
Basophils Absolute: 0 cells/uL (ref 0–200)
EOS PCT: 2 %
Eosinophils Absolute: 232 cells/uL (ref 15–500)
HCT: 42.3 % (ref 35.0–45.0)
Hemoglobin: 13.7 g/dL (ref 11.7–15.5)
LYMPHS PCT: 23 %
Lymphs Abs: 2668 cells/uL (ref 850–3900)
MCH: 26.1 pg — ABNORMAL LOW (ref 27.0–33.0)
MCHC: 32.4 g/dL (ref 32.0–36.0)
MCV: 80.7 fL (ref 80.0–100.0)
MPV: 12.1 fL (ref 7.5–12.5)
Monocytes Absolute: 580 cells/uL (ref 200–950)
Monocytes Relative: 5 %
NEUTROS PCT: 70 %
Neutro Abs: 8120 cells/uL — ABNORMAL HIGH (ref 1500–7800)
Platelets: 259 10*3/uL (ref 140–400)
RBC: 5.24 MIL/uL — AB (ref 3.80–5.10)
RDW: 14.1 % (ref 11.0–15.0)
WBC: 11.6 10*3/uL — ABNORMAL HIGH (ref 3.8–10.8)

## 2017-02-14 LAB — TSH: TSH: 1.7 mIU/L

## 2017-02-14 MED ORDER — ACYCLOVIR 200 MG PO CAPS: 200.0000 mg | ORAL_CAPSULE | Freq: Every day | ORAL | 6 refills | Status: DC

## 2017-02-14 MED ORDER — VALACYCLOVIR HCL 500 MG PO TABS: ORAL_TABLET | ORAL | 1 refills | Status: DC

## 2017-02-14 NOTE — Addendum Note (Signed)
Addended by: Kem Parkinson on: 02/14/2017 04:45 PM   Modules accepted: Orders

## 2017-02-14 NOTE — Patient Instructions (Signed)
Carbohydrate Counting for Diabetes Mellitus, Adult Carbohydrate counting is a method for keeping track of how many carbohydrates you eat. Eating carbohydrates naturally increases the amount of sugar (glucose) in the blood. Counting how many carbohydrates you eat helps keep your blood glucose within normal limits, which helps you manage your diabetes (diabetes mellitus). It is important to know how many carbohydrates you can safely have in each meal. This is different for every person. A diet and nutrition specialist (registered dietitian) can help you make a meal plan and calculate how many carbohydrates you should have at each meal and snack. Carbohydrates are found in the following foods:  Grains, such as breads and cereals.  Dried beans and soy products.  Starchy vegetables, such as potatoes, peas, and corn.  Fruit and fruit juices.  Milk and yogurt.  Sweets and snack foods, such as cake, cookies, candy, chips, and soft drinks.  How do I count carbohydrates? There are two ways to count carbohydrates in food. You can use either of the methods or a combination of both. Reading "Nutrition Facts" on packaged food The "Nutrition Facts" list is included on the labels of almost all packaged foods and beverages in the [place] It includes:  The serving size.  Information about nutrients in each serving, including the grams (g) of carbohydrate per serving.  To use the "Nutrition Facts":  Decide how many servings you will have.  Multiply the number of servings by the number of carbohydrates per serving.  The resulting number is the total amount of carbohydrates that you will be having.  Learning standard serving sizes of other foods When you eat foods containing carbohydrates that are not packaged or do not include "Nutrition Facts" on the label, you need to measure the servings in order to count the amount of carbohydrates:  Measure the foods that you will eat with a food scale or  measuring cup, if needed.  Decide how many standard-size servings you will eat.  Multiply the number of servings by 15. Most carbohydrate-rich foods have about 15 g of carbohydrates per serving. ? For example, if you eat 8 oz (170 g) of strawberries, you will have eaten 2 servings and 30 g of carbohydrates (2 servings x 15 g = 30 g).  For foods that have more than one food mixed, such as soups and casseroles, you must count the carbohydrates in each food that is included.  The following list contains standard serving sizes of common carbohydrate-rich foods. Each of these servings has about 15 g of carbohydrates:   hamburger bun or  English muffin.   oz (15 mL) syrup.   oz (14 g) jelly.  1 slice of bread.  1 six-inch tortilla.  3 oz (85 g) cooked rice or pasta.  4 oz (113 g) cooked dried beans.  4 oz (113 g) starchy vegetable, such as peas, corn, or potatoes.  4 oz (113 g) hot cereal.  4 oz (113 g) mashed potatoes or  of a large baked potato.  4 oz (113 g) canned or frozen fruit.  4 oz (120 mL) fruit juice.  4-6 crackers.  6 chicken nuggets.  6 oz (170 g) unsweetened dry cereal.  6 oz (170 g) plain fat-free yogurt or yogurt sweetened with artificial sweeteners.  8 oz (240 mL) milk.  8 oz (170 g) fresh fruit or one small piece of fruit.  24 oz (680 g) popped popcorn.  Example of carbohydrate counting Sample meal  3 oz (85 g) chicken breast.    6 oz (170 g) brown rice.  4 oz (113 g) corn.  8 oz (240 mL) milk.  8 oz (170 g) strawberries with sugar-free whipped topping. Carbohydrate calculation 1. Identify the foods that contain carbohydrates: ? Rice. ? Corn. ? Milk. ? Strawberries. 2. Calculate how many servings you have of each food: ? 2 servings rice. ? 1 serving corn. ? 1 serving milk. ? 1 serving strawberries. 3. Multiply each number of servings by 15 g: ? 2 servings rice x 15 g = 30 g. ? 1 serving corn x 15 g = 15 g. ? 1 serving milk x 15  g = 15 g. ? 1 serving strawberries x 15 g = 15 g. 4. Add together all of the amounts to find the total grams of carbohydrates eaten: ? 30 g + 15 g + 15 g + 15 g = 75 g of carbohydrates total. This information is not intended to replace advice given to you by your health care provider. Make sure you discuss any questions you have with your health care provider. Document Released: 06/14/2005 Document Revised: 01/02/2016 Document Reviewed: 11/26/2015 Elsevier Interactive Patient Education  2018 [place] Maintenance, Female Adopting a healthy lifestyle and getting preventive care can go a long way to promote health and wellness. Talk with your health care provider about what schedule of regular examinations is right for you. This is a good chance for you to check in with your provider about disease prevention and staying healthy. In between checkups, there are plenty of things you can do on your own. Experts have done a lot of research about which lifestyle changes and preventive measures are most likely to keep you healthy. Ask your health care provider for more information. Weight and diet Eat a healthy diet  Be sure to include plenty of vegetables, fruits, low-fat dairy products, and lean protein.  Do not eat a lot of foods high in solid fats, added sugars, or salt.  Get regular exercise. This is one of the most important things you can do for your health. ? Most adults should exercise for at least 150 minutes each week. The exercise should increase your heart rate and make you sweat (moderate-intensity exercise). ? Most adults should also do strengthening exercises at least twice a week. This is in addition to the moderate-intensity exercise.  Maintain a healthy weight  Body mass index (BMI) is a measurement that can be used to identify possible weight problems. It estimates body fat based on height and weight. Your health care provider can help determine your BMI and help you  achieve or maintain a healthy weight.  For females 36 years of age and older: ? A BMI below 18.5 is considered underweight. ? A BMI of 18.5 to 24.9 is normal. ? A BMI of 25 to 29.9 is considered overweight. ? A BMI of 30 and above is considered obese.  Watch levels of cholesterol and blood lipids  You should start having your blood tested for lipids and cholesterol at 36 years of age, then have this test every 5 years.  You may need to have your cholesterol levels checked more often if: ? Your lipid or cholesterol levels are high. ? You are older than 36 years of age. ? You are at high risk for heart disease.  Cancer screening Lung Cancer  Lung cancer screening is recommended for adults 52-25 years old who are at high risk for lung cancer because of a history of smoking.  A  yearly low-dose CT scan of the lungs is recommended for people who: ? Currently smoke. ? Have quit within the past 15 years. ? Have at least a 30-pack-year history of smoking. A pack year is smoking an average of one pack of cigarettes a day for 1 year.  Yearly screening should continue until it has been 15 years since you quit.  Yearly screening should stop if you develop a health problem that would prevent you from having lung cancer treatment.  Breast Cancer  Practice breast self-awareness. This means understanding how your breasts normally appear and feel.  It also means doing regular breast self-exams. Let your health care provider know about any changes, no matter how small.  If you are in your 20s or 30s, you should have a clinical breast exam (CBE) by a health care provider every 1-3 years as part of a regular health exam.  If you are 4 or older, have a CBE every year. Also consider having a breast X-ray (mammogram) every year.  If you have a family history of breast cancer, talk to your health care provider about genetic screening.  If you are at high risk for breast cancer, talk to your health  care provider about having an MRI and a mammogram every year.  Breast cancer gene (BRCA) assessment is recommended for women who have family members with BRCA-related cancers. BRCA-related cancers include: ? Breast. ? Ovarian. ? Tubal. ? Peritoneal cancers.  Results of the assessment will determine the need for genetic counseling and BRCA1 and BRCA2 testing.  Cervical Cancer Your health care provider may recommend that you be screened regularly for cancer of the pelvic organs (ovaries, uterus, and vagina). This screening involves a pelvic examination, including checking for microscopic changes to the surface of your cervix (Pap test). You may be encouraged to have this screening done every 3 years, beginning at age 12.  For women ages 13-65, health care providers may recommend pelvic exams and Pap testing every 3 years, or they may recommend the Pap and pelvic exam, combined with testing for human papilloma virus (HPV), every 5 years. Some types of HPV increase your risk of cervical cancer. Testing for HPV may also be done on women of any age with unclear Pap test results.  Other health care providers may not recommend any screening for nonpregnant women who are considered low risk for pelvic cancer and who do not have symptoms. Ask your health care provider if a screening pelvic exam is right for you.  If you have had past treatment for cervical cancer or a condition that could lead to cancer, you need Pap tests and screening for cancer for at least 20 years after your treatment. If Pap tests have been discontinued, your risk factors (such as having a new sexual partner) need to be reassessed to determine if screening should resume. Some women have medical problems that increase the chance of getting cervical cancer. In these cases, your health care provider may recommend more frequent screening and Pap tests.  Colorectal Cancer  This type of cancer can be detected and often  prevented.  Routine colorectal cancer screening usually begins at 36 years of age and continues through 36 years of age.  Your health care provider may recommend screening at an earlier age if you have risk factors for colon cancer.  Your health care provider may also recommend using home test kits to check for hidden blood in the stool.  A small camera at the end of  a tube can be used to examine your colon directly (sigmoidoscopy or colonoscopy). This is done to check for the earliest forms of colorectal cancer.  Routine screening usually begins at age 45.  Direct examination of the colon should be repeated every 5-10 years through 36 years of age. However, you may need to be screened more often if early forms of precancerous polyps or small growths are found.  Skin Cancer  Check your skin from head to toe regularly.  Tell your health care provider about any new moles or changes in moles, especially if there is a change in a mole's shape or color.  Also tell your health care provider if you have a mole that is larger than the size of a pencil eraser.  Always use sunscreen. Apply sunscreen liberally and repeatedly throughout the day.  Protect yourself by wearing long sleeves, pants, a wide-brimmed hat, and sunglasses whenever you are outside.  Heart disease, diabetes, and high blood pressure  High blood pressure causes heart disease and increases the risk of stroke. High blood pressure is more likely to develop in: ? People who have blood pressure in the high end of the normal range (130-139/85-89 mm Hg). ? People who are overweight or obese. ? People who are African American.  If you are 65-44 years of age, have your blood pressure checked every 3-5 years. If you are 35 years of age or older, have your blood pressure checked every year. You should have your blood pressure measured twice-once when you are at a hospital or clinic, and once when you are not at a hospital or clinic.  Record the average of the two measurements. To check your blood pressure when you are not at a hospital or clinic, you can use: ? An automated blood pressure machine at a pharmacy. ? A home blood pressure monitor.  If you are between 55 years and 4 years old, ask your health care provider if you should take aspirin to prevent strokes.  Have regular diabetes screenings. This involves taking a blood sample to check your fasting blood sugar level. ? If you are at a normal weight and have a low risk for diabetes, have this test once every three years after 36 years of age. ? If you are overweight and have a high risk for diabetes, consider being tested at a younger age or more often. Preventing infection Hepatitis B  If you have a higher risk for hepatitis B, you should be screened for this virus. You are considered at high risk for hepatitis B if: ? You were born in a country where hepatitis B is common. Ask your health care provider which countries are considered high risk. ? Your parents were born in a high-risk country, and you have not been immunized against hepatitis B (hepatitis B vaccine). ? You have HIV or AIDS. ? You use needles to inject street drugs. ? You live with someone who has hepatitis B. ? You have had sex with someone who has hepatitis B. ? You get hemodialysis treatment. ? You take certain medicines for conditions, including cancer, organ transplantation, and autoimmune conditions.  Hepatitis C  Blood testing is recommended for: ? Everyone born from [birth year] through 1965. ? Anyone with known risk factors for hepatitis C.  Sexually transmitted infections (STIs)  You should be screened for sexually transmitted infections (STIs) including gonorrhea and chlamydia if: ? You are sexually active and are younger than 36 years of age. ? You are older than  36 years of age and your health care provider tells you that you are at risk for this type of infection. ? Your sexual  activity has changed since you were last screened and you are at an increased risk for chlamydia or gonorrhea. Ask your health care provider if you are at risk.  If you do not have HIV, but are at risk, it may be recommended that you take a prescription medicine daily to prevent HIV infection. This is called pre-exposure prophylaxis (PrEP). You are considered at risk if: ? You are sexually active and do not regularly use condoms or know the HIV status of your partner(s). ? You take drugs by injection. ? You are sexually active with a partner who has HIV.  Talk with your health care provider about whether you are at high risk of being infected with HIV. If you choose to begin PrEP, you should first be tested for HIV. You should then be tested every 3 months for as long as you are taking PrEP. Pregnancy  If you are premenopausal and you may become pregnant, ask your health care provider about preconception counseling.  If you may become pregnant, take 400 to 800 micrograms (mcg) of folic acid every day.  If you want to prevent pregnancy, talk to your health care provider about birth control (contraception). Osteoporosis and menopause  Osteoporosis is a disease in which the bones lose minerals and strength with aging. This can result in serious bone fractures. Your risk for osteoporosis can be identified using a bone density scan.  If you are 29 years of age or older, or if you are at risk for osteoporosis and fractures, ask your health care provider if you should be screened.  Ask your health care provider whether you should take a calcium or vitamin D supplement to lower your risk for osteoporosis.  Menopause may have certain physical symptoms and risks.  Hormone replacement therapy may reduce some of these symptoms and risks. Talk to your health care provider about whether hormone replacement therapy is right for you. Follow these instructions at home:  Schedule regular health, dental,  and eye exams.  Stay current with your immunizations.  Do not use any tobacco products including cigarettes, chewing tobacco, or electronic cigarettes.  If you are pregnant, do not drink alcohol.  If you are breastfeeding, limit how much and how often you drink alcohol.  Limit alcohol intake to no more than 1 drink per day for nonpregnant women. One drink equals 12 ounces of beer, 5 ounces of wine, or 1 ounces of hard liquor.  Do not use street drugs.  Do not share needles.  Ask your health care provider for help if you need support or information about quitting drugs.  Tell your health care provider if you often feel depressed.  Tell your health care provider if you have ever been abused or do not feel safe at home. This information is not intended to replace advice given to you by your health care provider. Make sure you discuss any questions you have with your health care provider. Document Released: 12/28/2010 Document Revised: 11/20/2015 Document Reviewed: 03/18/2015 Elsevier Interactive Patient Education  [employer].

## 2017-02-14 NOTE — Progress Notes (Signed)
[name] 07/25/1980 [id number]    History:    Presents for annual exam.  Rare spotting Mirena IUD 03/2015. Same partner. 2010 LGSIL with normal Paps after. Same partner, years denies need for STD screening..   Past medical history, past surgical history, family history and social history were all reviewed and documented in the EPIC chart. 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 Son is 10, 5th grade doing well.  ROS:  A ROS was performed and pertinent positives and negatives are included.  Exam:  Vitals:   02/14/17 1455  BP: 128/80  Weight: 178 lb (80.7 kg)  Height: 5\' 6"  (1.676 m)   Body mass index is 28.73 kg/m.   General appearance:  Normal Thyroid:  Symmetrical, normal in size, without palpable masses or nodularity. Respiratory  Auscultation:  Clear without wheezing or rhonchi Cardiovascular  Auscultation:  Regular rate, without rubs, murmurs or gallops  Edema/varicosities:  Not grossly evident Abdominal  Soft,nontender, without masses, guarding or rebound.  Liver/spleen:  No organomegaly noted  Hernia:  None appreciated  Skin  Inspection:  Grossly normal   Breasts: Examined lying and sitting.     Right: Without masses, retractions, discharge or axillary adenopathy.     Left: Without masses, retractions, discharge or axillary adenopathy. Gentitourinary   Inguinal/mons:  Normal without inguinal adenopathy  External genitalia:  Normal  BUS/Urethra/Skene's glands:  Normal  Vagina:  Normal  Cervix:  Normal IUD difficult to see in os  Uterus:   normal in size, shape and contour.  Midline and mobile  Adnexa/parametria:     Rt: Without masses or tenderness.   Lt: Without masses or tenderness.  Anus and perineum: Normal  Digital rectal exam: Normal sphincter tone without palpated masses or tenderness  Assessment/Plan:  36 y.o. S BF G2 P1 for annual exam with complaint of difficulty losing weight despite diet and exercise  03/2015 Mirena IUD rare  spotting HSV rare outbreaks 2010 LGSIL normal after  Plan: SBE's, exercise, calcium rich diet, MVI daily encouraged. Continue healthy lifestyle of diet and exercise. Acyclovir 200 mg by mouth 5 times daily as needed. Prescription, proper use given and reviewed. CBC, glucose, TSH, Pap with HR HPV typing, screening guidelines reviewed.  Harrington Challenger [facility], 3:22 PM 02/14/2017

## 2017-02-15 LAB — GLUCOSE, RANDOM: Glucose, Bld: 81 mg/dL (ref 65–99)

## 2017-02-15 LAB — PAP, TP IMAGING W/ HPV RNA, RFLX HPV TYPE 16,18/45: HPV mRNA, High Risk: NOT DETECTED

## 2017-03-07 ENCOUNTER — Encounter: Payer: 59 | Admitting: Women's Health

## 2018-02-20 ENCOUNTER — Encounter: Payer: Commercial Managed Care - PPO | Admitting: Women's Health

## 2018-06-24 ENCOUNTER — Other Ambulatory Visit: Payer: Self-pay | Admitting: Women's Health

## 2018-06-24 DIAGNOSIS — A609 Anogenital herpesviral infection, unspecified: Secondary | ICD-10-CM

## 2018-10-20 ENCOUNTER — Other Ambulatory Visit: Payer: Self-pay | Admitting: Women's Health

## 2018-10-20 DIAGNOSIS — A609 Anogenital herpesviral infection, unspecified: Secondary | ICD-10-CM

## 2018-11-24 ENCOUNTER — Telehealth: Payer: Self-pay | Admitting: *Deleted

## 2018-11-24 NOTE — Telephone Encounter (Signed)
Patient called asking if you would be willing to prescribed weight loss medication, she is wiling to schedule a televisit if you are willing to do so. I did explain you don't typically prescribe medication for weight loss, but I would check and get back with her.  Please advise

## 2018-11-27 NOTE — Telephone Encounter (Signed)
Telephone call, reviewed not an advocate of weight loss medication, statistically not significant for sustained weight loss, overdue for annual instructed to schedule.  Switched to appointments

## 2018-12-04 ENCOUNTER — Other Ambulatory Visit: Payer: Self-pay

## 2018-12-05 ENCOUNTER — Encounter: Payer: Self-pay | Admitting: Women's Health

## 2018-12-05 ENCOUNTER — Ambulatory Visit (INDEPENDENT_AMBULATORY_CARE_PROVIDER_SITE_OTHER): Payer: BC Managed Care – PPO | Admitting: Women's Health

## 2018-12-05 ENCOUNTER — Other Ambulatory Visit: Payer: Self-pay

## 2018-12-05 VITALS — BP 128/80 | Ht 66.0 in | Wt 193.0 lb

## 2018-12-05 DIAGNOSIS — Z01419 Encounter for gynecological examination (general) (routine) without abnormal findings: Secondary | ICD-10-CM | POA: Diagnosis not present

## 2018-12-05 DIAGNOSIS — A609 Anogenital herpesviral infection, unspecified: Secondary | ICD-10-CM | POA: Diagnosis not present

## 2018-12-05 MED ORDER — PHENTERMINE HCL 37.5 MG PO CAPS: 37.5000 mg | ORAL_CAPSULE | ORAL | 0 refills | Status: AC

## 2018-12-05 MED ORDER — ACYCLOVIR 200 MG PO CAPS: 200.0000 mg | ORAL_CAPSULE | Freq: Every day | ORAL | 6 refills | Status: DC

## 2018-12-05 NOTE — Patient Instructions (Signed)

## 2018-12-05 NOTE — Progress Notes (Signed)
[name] 05/10/1981 [id number]    History:    Presents for annual exam.  03/2015 Mirena IUD rare spotting.  2010 CIN-1 normal Paps after.  HSV-2 no outbreaks. Same partner greater than 4 years.  Past medical history, past surgical history, family history and social history were all reviewed and documented in the EPIC chart.  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  Father diabetes, son 72 doing well.  ROS:  A ROS was performed and pertinent positives and negatives are included.  Exam:  Vitals:   12/05/18 1401  BP: 128/80  Weight: 193 lb (87.5 kg)  Height: 5\' 6"  (1.676 m)   Body mass index is 31.15 kg/m.   General appearance:  Normal Thyroid:  Symmetrical, normal in size, without palpable masses or nodularity. Respiratory  Auscultation:  Clear without wheezing or rhonchi Cardiovascular  Auscultation:  Regular rate, without rubs, murmurs or gallops  Edema/varicosities:  Not grossly evident Abdominal  Soft,nontender, without masses, guarding or rebound.  Liver/spleen:  No organomegaly noted  Hernia:  None appreciated  Skin  Inspection:  Grossly normal   Breasts: Examined lying and sitting.     Right: Without masses, retractions, discharge or axillary adenopathy.     Left: Without masses, retractions, discharge or axillary adenopathy. Gentitourinary   Inguinal/mons:  Normal without inguinal adenopathy  External genitalia:  Normal  BUS/Urethra/Skene's glands:  Normal  Vagina:  Normal  Cervix:  Normal IUD string visible in os  Uterus:   normal in size, shape and contour.  Midline and mobile  Adnexa/parametria:     Rt: Without masses or tenderness.   Lt: Without masses or tenderness.  Anus and perineum: Normal  Digital rectal exam: Normal sphincter tone without palpated masses or tenderness  Assessment/Plan:  38 y.o. SBF G2, P1 for annual exam with complaint of difficulty losing weight.  03/2015 Mirena IUD rare  spotting Obesity 2010 CIN-1 normal Paps after HSV-2 no outbreaks  Plan: Requesting phentermine, prescription for phentermine 37.5 #30 with no refills, reviewed will only give 1 month with no refills.  Encouraged to start weight watchers, low calorie/carb diet and increase regular exercise.  Aware IUD is good for 5 years.  Zovirax 200 mg 5 times daily as needed #30 with refills.  SBEs, annual screening mammogram at 40, calcium rich foods, MVI daily encouraged.  CBC, glucose, will check lipid panel next year to come fasting.  Pap normal 2018, new screening guidelines reviewed.    Huel Cote [facility], 2:23 PM 12/05/2018

## 2018-12-06 LAB — URINALYSIS, COMPLETE W/RFL CULTURE
Bacteria, UA: NONE SEEN /HPF
Bilirubin Urine: NEGATIVE
Glucose, UA: NEGATIVE
Hgb urine dipstick: NEGATIVE
Hyaline Cast: NONE SEEN /LPF
Ketones, ur: NEGATIVE
Leukocyte Esterase: NEGATIVE
Nitrites, Initial: NEGATIVE
Protein, ur: NEGATIVE
RBC / HPF: NONE SEEN /HPF (ref 0–2)
Specific Gravity, Urine: 1.024 (ref 1.001–1.03)
Squamous Epithelial / LPF: NONE SEEN /HPF (ref <=5)
WBC, UA: NONE SEEN /HPF (ref 0–5)
pH: 5.5 (ref 5.0–8.0)

## 2018-12-06 LAB — CBC WITH DIFFERENTIAL/PLATELET
Absolute Monocytes: 588 cells/uL (ref 200–950)
Basophils Absolute: 49 cells/uL (ref 0–200)
Basophils Relative: 0.5 %
Eosinophils Absolute: 196 cells/uL (ref 15–500)
Eosinophils Relative: 2 %
HCT: 42.6 % (ref 35.0–45.0)
Hemoglobin: 13.8 g/dL (ref 11.7–15.5)
Lymphs Abs: 2029 cells/uL (ref 850–3900)
MCH: 25.9 pg — ABNORMAL LOW (ref 27.0–33.0)
MCHC: 32.4 g/dL (ref 32.0–36.0)
MCV: 79.9 fL — ABNORMAL LOW (ref 80.0–100.0)
MPV: 13.3 fL — ABNORMAL HIGH (ref 7.5–12.5)
Monocytes Relative: 6 %
Neutro Abs: 6938 cells/uL (ref 1500–7800)
Neutrophils Relative %: 70.8 %
Platelets: 238 10*3/uL (ref 140–400)
RBC: 5.33 10*6/uL — ABNORMAL HIGH (ref 3.80–5.10)
RDW: 13.1 % (ref 11.0–15.0)
Total Lymphocyte: 20.7 %
WBC: 9.8 10*3/uL (ref 3.8–10.8)

## 2018-12-06 LAB — NO CULTURE INDICATED

## 2018-12-06 LAB — GLUCOSE, RANDOM: Glucose, Bld: 80 mg/dL (ref 65–99)

## 2018-12-26 DIAGNOSIS — Z20828 Contact with and (suspected) exposure to other viral communicable diseases: Secondary | ICD-10-CM | POA: Diagnosis not present

## 2019-02-21 ENCOUNTER — Encounter: Payer: Self-pay | Admitting: Women's Health

## 2019-03-20 ENCOUNTER — Encounter: Payer: Self-pay | Admitting: Gynecology

## 2019-10-13 DIAGNOSIS — Z03818 Encounter for observation for suspected exposure to other biological agents ruled out: Secondary | ICD-10-CM | POA: Diagnosis not present

## 2019-10-13 DIAGNOSIS — Z20828 Contact with and (suspected) exposure to other viral communicable diseases: Secondary | ICD-10-CM | POA: Diagnosis not present

## 2019-11-03 DIAGNOSIS — Z91038 Other insect allergy status: Secondary | ICD-10-CM | POA: Diagnosis not present

## 2019-11-06 DIAGNOSIS — Z20828 Contact with and (suspected) exposure to other viral communicable diseases: Secondary | ICD-10-CM | POA: Diagnosis not present

## 2019-11-06 DIAGNOSIS — Z03818 Encounter for observation for suspected exposure to other biological agents ruled out: Secondary | ICD-10-CM | POA: Diagnosis not present

## 2019-11-16 DIAGNOSIS — Z20828 Contact with and (suspected) exposure to other viral communicable diseases: Secondary | ICD-10-CM | POA: Diagnosis not present

## 2019-11-16 DIAGNOSIS — Z03818 Encounter for observation for suspected exposure to other biological agents ruled out: Secondary | ICD-10-CM | POA: Diagnosis not present

## 2020-01-08 DIAGNOSIS — Z0000 Encounter for general adult medical examination without abnormal findings: Secondary | ICD-10-CM | POA: Diagnosis not present

## 2020-01-08 DIAGNOSIS — Z1331 Encounter for screening for depression: Secondary | ICD-10-CM | POA: Diagnosis not present

## 2020-01-08 DIAGNOSIS — Z13220 Encounter for screening for lipoid disorders: Secondary | ICD-10-CM | POA: Diagnosis not present

## 2020-01-08 DIAGNOSIS — Z131 Encounter for screening for diabetes mellitus: Secondary | ICD-10-CM | POA: Diagnosis not present

## 2020-01-22 DIAGNOSIS — R0602 Shortness of breath: Secondary | ICD-10-CM | POA: Diagnosis not present

## 2020-01-22 DIAGNOSIS — R5383 Other fatigue: Secondary | ICD-10-CM | POA: Diagnosis not present

## 2020-01-22 DIAGNOSIS — R635 Abnormal weight gain: Secondary | ICD-10-CM | POA: Diagnosis not present

## 2020-01-25 ENCOUNTER — Other Ambulatory Visit: Payer: Self-pay

## 2020-01-25 DIAGNOSIS — A609 Anogenital herpesviral infection, unspecified: Secondary | ICD-10-CM

## 2020-01-25 MED ORDER — ACYCLOVIR 200 MG PO CAPS: 200.0000 mg | ORAL_CAPSULE | Freq: Every day | ORAL | 4 refills | Status: DC

## 2020-02-13 DIAGNOSIS — L700 Acne vulgaris: Secondary | ICD-10-CM | POA: Diagnosis not present

## 2020-03-31 ENCOUNTER — Other Ambulatory Visit: Payer: Self-pay

## 2020-03-31 ENCOUNTER — Ambulatory Visit (INDEPENDENT_AMBULATORY_CARE_PROVIDER_SITE_OTHER): Payer: BC Managed Care – PPO | Admitting: Nurse Practitioner

## 2020-03-31 ENCOUNTER — Encounter: Payer: Self-pay | Admitting: Nurse Practitioner

## 2020-03-31 VITALS — BP 122/78 | Ht 66.0 in | Wt 194.0 lb

## 2020-03-31 DIAGNOSIS — Z01419 Encounter for gynecological examination (general) (routine) without abnormal findings: Secondary | ICD-10-CM | POA: Diagnosis not present

## 2020-03-31 DIAGNOSIS — Z975 Presence of (intrauterine) contraceptive device: Secondary | ICD-10-CM | POA: Diagnosis not present

## 2020-03-31 DIAGNOSIS — N898 Other specified noninflammatory disorders of vagina: Secondary | ICD-10-CM

## 2020-03-31 LAB — WET PREP FOR TRICH, YEAST, CLUE

## 2020-03-31 NOTE — Progress Notes (Signed)
   [name] April 10, 1981 [id number]   History:  39 y.o. G2P0011 presents for annual exam. Mirena IUD inserted 03/2015, has had some intermittent light spotting for about a month. 2010 CIN-1, subsequent paps normal. HSV-2, no outbreaks. Sexually active with same female partner.   Gynecologic History No LMP recorded (lmp unknown). (Menstrual status: IUD).   Contraception: IUD Last Pap: 02/14/2017. Results were: normal   Past medical history, past surgical history, family history and social history were all reviewed and documented in the EPIC chart.  ROS:  A ROS was performed and pertinent positives and negatives are included.  Exam:  Vitals:   03/31/20 1230  BP: 122/78  Weight: 194 lb (88 kg)  Height: 5\' 6"  (1.676 m)   Body mass index is 31.31 kg/m.  General appearance:  Normal Thyroid:  Symmetrical, normal in size, without palpable masses or nodularity. Respiratory  Auscultation:  Clear without wheezing or rhonchi Cardiovascular  Auscultation:  Regular rate, without rubs, murmurs or gallops  Edema/varicosities:  Not grossly evident Abdominal  Soft,nontender, without masses, guarding or rebound.  Liver/spleen:  No organomegaly noted  Hernia:  None appreciated  Skin  Inspection:  Grossly normal   Breasts: Examined lying and sitting.   Right: Without masses, retractions, discharge or axillary adenopathy.   Left: Without masses, retractions, discharge or axillary adenopathy. Gentitourinary   Inguinal/mons:  Normal without inguinal adenopathy  External genitalia:  Normal  BUS/Urethra/Skene's glands:  Normal  Vagina:  Normal  Cervix:  Normal, IUD string not visible  Uterus:  Retroverted, normal in size, shape and contour.  Midline and mobile  Adnexa/parametria:     Rt: Without masses or tenderness.   Lt: Without masses or tenderness.  Anus and perineum: Normal  Wet prep negative  Assessment/Plan:  39 y.o. G for annual exam.   Well female exam with routine  gynecological exam - Plan: CBC with Differential/Platelet, Comprehensive metabolic panel. Education provided on SBEs, importance of preventative screenings, current guidelines, high calcium diet, regular exercise, and multivitamin daily.   Screening for cervical cancer - 2010 CIN-1, subsequent paps normal. Most recent 01/2017. Pap today.   IUD (intrauterine device) in place - Scheduled for removal and reinsertion 04/08/2020. Light spotting in the past month. IUD string not visible during exam.  Vaginal discharge - Plan: WET PREP FOR TRICH, YEAST, CLUE  Follow up in 1 year for annual       06/08/2020 [facility], 12:36 PM 03/31/2020

## 2020-03-31 NOTE — Addendum Note (Signed)
Addended by: Tito Dine on: 03/31/2020 03:20 PM   Modules accepted: Orders

## 2020-03-31 NOTE — Addendum Note (Signed)
Addended byWyline Beady on: 03/31/2020 02:22 PM   Modules accepted: Orders

## 2020-03-31 NOTE — Patient Instructions (Signed)
Health Maintenance, Female Adopting a healthy lifestyle and getting preventive care are important in promoting health and wellness. Ask your health care provider about:  The right schedule for you to have regular tests and exams.  Things you can do on your own to prevent diseases and keep yourself healthy. What should I know about diet, weight, and exercise? Eat a healthy diet   Eat a diet that includes plenty of vegetables, fruits, low-fat dairy products, and lean protein.  Do not eat a lot of foods that are high in solid fats, added sugars, or sodium. Maintain a healthy weight Body mass index (BMI) is used to identify weight problems. It estimates body fat based on height and weight. Your health care provider can help determine your BMI and help you achieve or maintain a healthy weight. Get regular exercise Get regular exercise. This is one of the most important things you can do for your health. Most adults should:  Exercise for at least 150 minutes each week. The exercise should increase your heart rate and make you sweat (moderate-intensity exercise).  Do strengthening exercises at least twice a week. This is in addition to the moderate-intensity exercise.  Spend less time sitting. Even light physical activity can be beneficial. Watch cholesterol and blood lipids Have your blood tested for lipids and cholesterol at 39 years of age, then have this test every 5 years. Have your cholesterol levels checked more often if:  Your lipid or cholesterol levels are high.  You are older than 40 years of age.  You are at high risk for heart disease. What should I know about cancer screening? Depending on your health history and family history, you may need to have cancer screening at various ages. This may include screening for:  Breast cancer.  Cervical cancer.  Colorectal cancer.  Skin cancer.  Lung cancer. What should I know about heart disease, diabetes, and high blood  pressure? Blood pressure and heart disease  High blood pressure causes heart disease and increases the risk of stroke. This is more likely to develop in people who have high blood pressure readings, are of African descent, or are overweight.  Have your blood pressure checked: ? Every 3-5 years if you are 18-39 years of age. ? Every year if you are 40 years old or older. Diabetes Have regular diabetes screenings. This checks your fasting blood sugar level. Have the screening done:  Once every three years after age 40 if you are at a normal weight and have a low risk for diabetes.  More often and at a younger age if you are overweight or have a high risk for diabetes. What should I know about preventing infection? Hepatitis B If you have a higher risk for hepatitis B, you should be screened for this virus. Talk with your health care provider to find out if you are at risk for hepatitis B infection. Hepatitis C Testing is recommended for:  Everyone born from 1945 through 1965.  Anyone with known risk factors for hepatitis C. Sexually transmitted infections (STIs)  Get screened for STIs, including gonorrhea and chlamydia, if: ? You are sexually active and are younger than 39 years of age. ? You are older than 39 years of age and your health care provider tells you that you are at risk for this type of infection. ? Your sexual activity has changed since you were last screened, and you are at increased risk for chlamydia or gonorrhea. Ask your health care provider if   you are at risk.  Ask your health care provider about whether you are at high risk for HIV. Your health care provider may recommend a prescription medicine to help prevent HIV infection. If you choose to take medicine to prevent HIV, you should first get tested for HIV. You should then be tested every 3 months for as long as you are taking the medicine. Pregnancy  If you are about to stop having your period (premenopausal) and  you may become pregnant, seek counseling before you get pregnant.  Take 400 to 800 micrograms (mcg) of folic acid every day if you become pregnant.  Ask for birth control (contraception) if you want to prevent pregnancy. Osteoporosis and menopause Osteoporosis is a disease in which the bones lose minerals and strength with aging. This can result in bone fractures. If you are 65 years old or older, or if you are at risk for osteoporosis and fractures, ask your health care provider if you should:  Be screened for bone loss.  Take a calcium or vitamin D supplement to lower your risk of fractures.  Be given hormone replacement therapy (HRT) to treat symptoms of menopause. Follow these instructions at home: Lifestyle  Do not use any products that contain nicotine or tobacco, such as cigarettes, e-cigarettes, and chewing tobacco. If you need help quitting, ask your health care provider.  Do not use street drugs.  Do not share needles.  Ask your health care provider for help if you need support or information about quitting drugs. Alcohol use  Do not drink alcohol if: ? Your health care provider tells you not to drink. ? You are pregnant, may be pregnant, or are planning to become pregnant.  If you drink alcohol: ? Limit how much you use to 0-1 drink a day. ? Limit intake if you are breastfeeding.  Be aware of how much alcohol is in your drink. In the [place] one drink equals one 12 oz bottle of beer (355 mL), one 5 oz glass of wine (148 mL), or one 1 oz glass of hard liquor (44 mL). General instructions  Schedule regular health, dental, and eye exams.  Stay current with your vaccines.  Tell your health care provider if: ? You often feel depressed. ? You have ever been abused or do not feel safe at home. Summary  Adopting a healthy lifestyle and getting preventive care are important in promoting health and wellness.  Follow your health care provider's instructions about healthy  diet, exercising, and getting tested or screened for diseases.  Follow your health care provider's instructions on monitoring your cholesterol and blood pressure. This information is not intended to replace advice given to you by your health care provider. Make sure you discuss any questions you have with your health care provider. Document Revised: 06/07/2018 Document Reviewed: 06/07/2018 Elsevier Patient Education  2020 Elsevier Inc.  

## 2020-04-01 LAB — PAP IG W/ RFLX HPV ASCU

## 2020-04-08 ENCOUNTER — Other Ambulatory Visit: Payer: Self-pay

## 2020-04-08 ENCOUNTER — Ambulatory Visit (INDEPENDENT_AMBULATORY_CARE_PROVIDER_SITE_OTHER): Payer: BC Managed Care – PPO | Admitting: Obstetrics and Gynecology

## 2020-04-08 ENCOUNTER — Encounter: Payer: Self-pay | Admitting: Obstetrics and Gynecology

## 2020-04-08 VITALS — BP 118/70

## 2020-04-08 DIAGNOSIS — T8332XA Displacement of intrauterine contraceptive device, initial encounter: Secondary | ICD-10-CM | POA: Diagnosis not present

## 2020-04-08 NOTE — Progress Notes (Signed)
   [name] 02-24-1981 [id number]  SUBJECTIVE:  39 y.o. G61P1011 female presents for Mirena IUD removal and reinsertion of a new device.  She had the IUD placed 03/2015.  At her recent routine exam the strings were not visualized.  She has been amenorrheic with the IUD in place.  Current Outpatient Medications  Medication Sig Dispense Refill  . acyclovir (ZOVIRAX) 200 MG capsule Take 1 capsule (200 mg total) by mouth 5 (five) times daily. As needed 30 capsule 4  . levonorgestrel (MIRENA) 20 MCG/24HR IUD 1 each by Intrauterine route once.    . Multiple Vitamin (MULTIVITAMIN) capsule Take 1 capsule by mouth daily.    . phentermine 37.5 MG capsule Take 1 capsule (37.5 mg total) by mouth every morning. (Patient not taking: Reported on 04/08/2020) 30 capsule 0   No current facility-administered medications for this visit.   Allergies: Patient has no known allergies.  No LMP recorded (lmp unknown). (Menstrual status: IUD).  Past medical history,surgical history, problem list, medications, allergies, family history and social history were all reviewed and documented as reviewed in the EPIC chart.   OBJECTIVE:  BP 118/70   LMP  (LMP Unknown) Comment: spotting The patient appears well, alert, oriented, in no distress. PELVIC EXAM: VULVA: normal appearing vulva with no masses, tenderness or lesions, VAGINA: normal appearing vagina with normal color and discharge, no lesions, CERVIX: normal appearing cervix without discharge or lesions IUD strings not visualized Gentle probing of the endocervical canal using a Cytobrush followed by gentle grasping at the distal endocervical canal using the [place] forceps and the Tischler forceps was not successful in retrieving the strings.  The cervical speculum was used to view the endocervical canal and the strings were not visualized by this method either.  The procedure was then abandoned at this point. Chaperone: Kennon Portela present during the  examination  ASSESSMENT:  39 y.o. G2P1011 here with nonvisible IUD string  PLAN:  I recommend that the patient return for a pelvic ultrasound to ensure the IUD is in an intrauterine location.  I would then proceed with removal under ultrasound guidance and if she still wants to have another IUD placed, we can just replace a Mirena IUD at that time.  Most likely the strings are just coiled up higher into the cervical canal, but there is possibility of either displacement/expulsion of the IUD or perforation so we want to check on ultrasound first before probing in the endometrial cavity.  The patient is understanding of this and will make the appointment as recommended.   Theresia Majors MD 04/08/20

## 2020-04-15 ENCOUNTER — Other Ambulatory Visit: Payer: Self-pay | Admitting: Obstetrics and Gynecology

## 2020-04-15 ENCOUNTER — Other Ambulatory Visit: Payer: Self-pay

## 2020-04-15 ENCOUNTER — Encounter: Payer: Self-pay | Admitting: Obstetrics and Gynecology

## 2020-04-15 ENCOUNTER — Ambulatory Visit (INDEPENDENT_AMBULATORY_CARE_PROVIDER_SITE_OTHER): Payer: BC Managed Care – PPO

## 2020-04-15 ENCOUNTER — Ambulatory Visit: Payer: BC Managed Care – PPO | Admitting: Obstetrics and Gynecology

## 2020-04-15 DIAGNOSIS — T8332XA Displacement of intrauterine contraceptive device, initial encounter: Secondary | ICD-10-CM | POA: Diagnosis not present

## 2020-04-15 DIAGNOSIS — Z30433 Encounter for removal and reinsertion of intrauterine contraceptive device: Secondary | ICD-10-CM

## 2020-04-15 DIAGNOSIS — Z30430 Encounter for insertion of intrauterine contraceptive device: Secondary | ICD-10-CM

## 2020-04-15 DIAGNOSIS — Z30432 Encounter for removal of intrauterine contraceptive device: Secondary | ICD-10-CM

## 2020-04-15 HISTORY — PX: INTRAUTERINE DEVICE (IUD) INSERTION: SHX5877

## 2020-04-15 NOTE — Progress Notes (Signed)
   [name]  01/11/1981 [id number]  HPI The patient is a 39 y.o. G2P1011 who presents today for Mirena IUD removal and insertion of IUD.  We chose to do this with assistance of ultrasound today because we were unable to locate her IUD strings her last visit and we wanted to ensure that the IUD was still in place.  I counseled her on the Mirena IUD including in the initial period of time after insertion to expect cramping and/or abnormal bleeding, especially in the first 3-6 months use.  Approved for 5 years of contraception.  After this time period the period can get very light or some women become amenorrheic.  Potential hormonal side effects discussed.  Insertion risks include infection, uterine perforation and device migration into the abdomen which would require surgical removal, or the device can become dislodged causing pain or fall out of which would preclude effective contraception.  Recommend using backup contraception (condoms) for the first week after insertion. Monthly self string checks (or more frequent) are recommended as IUD expulsion does occur.  She wanted to proceed with the Mirena IUD insertion and provided written consent.   Physical Exam  LMP  (LMP Unknown) Comment: spotting  General: Pleasant female, no acute distress, alert and oriented PELVIC EXAM: VULVA: normal appearing vulva with no masses, tenderness or lesions, VAGINA: normal appearing vagina with normal color and discharge, no lesions, CERVIX: normal appearing cervix without discharge or lesions, UTERUS: uterus is normal size, shape, consistency and nontender, ADNEXA: normal adnexa in size, nontender and no masses  Pelvic ultrasound retroverted uterus 7.6 x 5.1 x 4.4 cm No myometrial masses Endometrium 3.9 mm.  IUD appears to be in normal position.  Tip of IUD string is 1 cm proximal to the external cervical os. Bilateral ovaries normal with normal follicle patterns. No adnexal masses.  No free fluid. Post  procedure ultrasound indicated normal intrauterine position of the newly placed IUD.  Lot [id number], expiration 03/2022  Procedure note IUD removal and Mirena IUD insertion With confirmation of the intrauterine location of the IUD and the string tips just proximal to the external os, a small grasper was used to blindly grasp for the strings about 1 cm proximal to the external os and this maneuver was quickly successful in retrieving the IUD strings.  Gentle traction was applied and the IUD was removed with a small amount of resistance which suggested the IUD may have been slightly embedded.  The patient also had some transient discomfort but this did improve before insertion of the new device.  The IUD was removed in its entirety as demonstrated to the patient and staff. The cervix was cleansed with a Betadine swab x2.  Uterine sound was used to obtain a sound length of 8 cm.  Maintaining sterility, the flange on the IUD insertion tube was set to the appropriate length and the insertion tube was inserted into the cervix and endometrial cavity without difficulty until gentle fundal resistance was met.  The arms of the IUD were deployed and the Mirena was inserted without difficulty. The strings were trimmed to a length of 3 cm.  The patient tolerated the procedure well without any complication.  Chrisandra Carota, RDMS present for exam and procedure   Joseph Pierini MD 04/15/20

## 2020-04-16 ENCOUNTER — Encounter: Payer: Self-pay | Admitting: Gynecology

## 2020-04-24 ENCOUNTER — Other Ambulatory Visit: Payer: BC Managed Care – PPO

## 2020-04-24 ENCOUNTER — Ambulatory Visit: Payer: BC Managed Care – PPO | Admitting: Obstetrics and Gynecology

## 2020-06-05 DIAGNOSIS — Z03818 Encounter for observation for suspected exposure to other biological agents ruled out: Secondary | ICD-10-CM | POA: Diagnosis not present

## 2020-06-05 DIAGNOSIS — J029 Acute pharyngitis, unspecified: Secondary | ICD-10-CM | POA: Diagnosis not present

## 2020-06-05 DIAGNOSIS — Z20822 Contact with and (suspected) exposure to covid-19: Secondary | ICD-10-CM | POA: Diagnosis not present

## 2020-06-07 DIAGNOSIS — H9201 Otalgia, right ear: Secondary | ICD-10-CM | POA: Diagnosis not present

## 2020-06-07 DIAGNOSIS — J069 Acute upper respiratory infection, unspecified: Secondary | ICD-10-CM | POA: Diagnosis not present

## 2021-10-02 DIAGNOSIS — B370 Candidal stomatitis: Secondary | ICD-10-CM | POA: Diagnosis not present

## 2021-10-31 DIAGNOSIS — K089 Disorder of teeth and supporting structures, unspecified: Secondary | ICD-10-CM | POA: Diagnosis not present

## 2022-05-10 DIAGNOSIS — T8389XA Other specified complication of genitourinary prosthetic devices, implants and grafts, initial encounter: Secondary | ICD-10-CM | POA: Diagnosis not present

## 2022-05-10 DIAGNOSIS — R635 Abnormal weight gain: Secondary | ICD-10-CM | POA: Diagnosis not present

## 2022-05-12 DIAGNOSIS — R635 Abnormal weight gain: Secondary | ICD-10-CM | POA: Diagnosis not present

## 2022-05-12 DIAGNOSIS — Z30431 Encounter for routine checking of intrauterine contraceptive device: Secondary | ICD-10-CM | POA: Diagnosis not present

## 2022-05-12 DIAGNOSIS — R5383 Other fatigue: Secondary | ICD-10-CM | POA: Diagnosis not present

## 2022-07-14 DIAGNOSIS — Z01419 Encounter for gynecological examination (general) (routine) without abnormal findings: Secondary | ICD-10-CM | POA: Diagnosis not present

## 2022-07-14 DIAGNOSIS — Z1231 Encounter for screening mammogram for malignant neoplasm of breast: Secondary | ICD-10-CM | POA: Diagnosis not present

## 2022-07-14 DIAGNOSIS — Z1389 Encounter for screening for other disorder: Secondary | ICD-10-CM | POA: Diagnosis not present

## 2022-07-14 DIAGNOSIS — Z130 Encounter for screening for diseases of the blood and blood-forming organs and certain disorders involving the immune mechanism: Secondary | ICD-10-CM | POA: Diagnosis not present

## 2023-05-01 DIAGNOSIS — B379 Candidiasis, unspecified: Secondary | ICD-10-CM | POA: Diagnosis not present

## 2023-05-01 DIAGNOSIS — Z6834 Body mass index (BMI) 34.0-34.9, adult: Secondary | ICD-10-CM | POA: Diagnosis not present

## 2023-05-01 DIAGNOSIS — H66002 Acute suppurative otitis media without spontaneous rupture of ear drum, left ear: Secondary | ICD-10-CM | POA: Diagnosis not present

## 2023-05-01 DIAGNOSIS — T3695XA Adverse effect of unspecified systemic antibiotic, initial encounter: Secondary | ICD-10-CM | POA: Diagnosis not present

## 2023-05-04 DIAGNOSIS — L02411 Cutaneous abscess of right axilla: Secondary | ICD-10-CM | POA: Diagnosis not present

## 2023-06-02 DIAGNOSIS — B3731 Acute candidiasis of vulva and vagina: Secondary | ICD-10-CM | POA: Diagnosis not present

## 2023-06-02 DIAGNOSIS — L732 Hidradenitis suppurativa: Secondary | ICD-10-CM | POA: Diagnosis not present

## 2023-06-13 DIAGNOSIS — L810 Postinflammatory hyperpigmentation: Secondary | ICD-10-CM | POA: Diagnosis not present

## 2023-06-13 DIAGNOSIS — L720 Epidermal cyst: Secondary | ICD-10-CM | POA: Diagnosis not present

## 2023-07-06 DIAGNOSIS — L732 Hidradenitis suppurativa: Secondary | ICD-10-CM | POA: Diagnosis not present

## 2023-08-05 DIAGNOSIS — L732 Hidradenitis suppurativa: Secondary | ICD-10-CM | POA: Diagnosis not present

## 2023-08-11 DIAGNOSIS — J029 Acute pharyngitis, unspecified: Secondary | ICD-10-CM | POA: Diagnosis not present

## 2023-08-11 DIAGNOSIS — R87612 Low grade squamous intraepithelial lesion on cytologic smear of cervix (LGSIL): Secondary | ICD-10-CM | POA: Diagnosis not present

## 2023-08-11 DIAGNOSIS — J00 Acute nasopharyngitis [common cold]: Secondary | ICD-10-CM | POA: Diagnosis not present

## 2023-08-11 DIAGNOSIS — Z6834 Body mass index (BMI) 34.0-34.9, adult: Secondary | ICD-10-CM | POA: Diagnosis not present

## 2023-08-25 DIAGNOSIS — Z01411 Encounter for gynecological examination (general) (routine) with abnormal findings: Secondary | ICD-10-CM | POA: Diagnosis not present

## 2023-08-25 DIAGNOSIS — Z1231 Encounter for screening mammogram for malignant neoplasm of breast: Secondary | ICD-10-CM | POA: Diagnosis not present

## 2023-08-25 DIAGNOSIS — Z202 Contact with and (suspected) exposure to infections with a predominantly sexual mode of transmission: Secondary | ICD-10-CM | POA: Diagnosis not present

## 2023-08-25 DIAGNOSIS — Z113 Encounter for screening for infections with a predominantly sexual mode of transmission: Secondary | ICD-10-CM | POA: Diagnosis not present

## 2023-08-25 DIAGNOSIS — Z130 Encounter for screening for diseases of the blood and blood-forming organs and certain disorders involving the immune mechanism: Secondary | ICD-10-CM | POA: Diagnosis not present

## 2023-08-25 DIAGNOSIS — Z1389 Encounter for screening for other disorder: Secondary | ICD-10-CM | POA: Diagnosis not present

## 2023-08-30 DIAGNOSIS — Z30431 Encounter for routine checking of intrauterine contraceptive device: Secondary | ICD-10-CM | POA: Diagnosis not present

## 2023-09-30 DIAGNOSIS — Z302 Encounter for sterilization: Secondary | ICD-10-CM | POA: Diagnosis not present

## 2023-09-30 DIAGNOSIS — Z8742 Personal history of other diseases of the female genital tract: Secondary | ICD-10-CM | POA: Diagnosis not present

## 2023-10-20 ENCOUNTER — Encounter: Payer: Self-pay | Admitting: Internal Medicine

## 2023-10-20 ENCOUNTER — Ambulatory Visit: Admitting: Internal Medicine

## 2023-10-20 VITALS — BP 120/70 | HR 82 | Temp 98.4°F | Ht 66.0 in | Wt 214.2 lb

## 2023-10-20 DIAGNOSIS — E6609 Other obesity due to excess calories: Secondary | ICD-10-CM | POA: Diagnosis not present

## 2023-10-20 DIAGNOSIS — Z6834 Body mass index (BMI) 34.0-34.9, adult: Secondary | ICD-10-CM

## 2023-10-20 DIAGNOSIS — L732 Hidradenitis suppurativa: Secondary | ICD-10-CM

## 2023-10-20 DIAGNOSIS — Z7689 Persons encountering health services in other specified circumstances: Secondary | ICD-10-CM

## 2023-10-20 DIAGNOSIS — E66811 Obesity, class 1: Secondary | ICD-10-CM

## 2023-10-20 NOTE — Patient Instructions (Addendum)
 Certain foods are often cited as potential triggers for flare-ups of hidradenitis suppurativa (HS). These include foods high in sugar, refined carbohydrates, and processed meats, as well as dairy and brewer's yeast. Additionally, some individuals report nightshade vegetables as triggers.

## 2023-10-20 NOTE — Progress Notes (Signed)
 I,Victoria T Basil Lim, CMA,acting as a Neurosurgeon for Smiley Dung, MD.,have documented all relevant documentation on the behalf of Smiley Dung, MD,as directed by  Smiley Dung, MD while in the presence of Smiley Dung, MD.  Subjective:  Patient ID: [name] , female    DOB: [birth year] , 43 y.o.   MRN: [id number]  Chief Complaint  Patient presents with   Establish Care    Patient presents today to establish care. Previous pcp: Windell Hasty. Referred to prev pcp by GYN.: Dr Arlyne Lame [facility])  She has an issue with boils under her arm. She states this issue came out of nowhere, end of last year beginning of this year. She wants to know if she could do a food sensitivity test to see if diet could be triggering this issue.  She is to get her tubes removed & uterine ablation on 06/06.     HPI Discussed the use of AI scribe software for clinical note transcription with the patient, who gave verbal consent to proceed.  History of Present Illness [name] is a 43 year old female who presents with recurrent boils and a possible diagnosis of hidradenitis suppurativa.  Since October 2024, she has experienced recurrent boils, initially developing a large cystic boil under her arm that required lancing at an urgent care facility. A possible diagnosis of hidradenitis suppurativa was suggested, and she was advised to follow up with a dermatologist. She has undergone two laser hair removal treatments and noticed a small bump under her arm after the last treatment.  Additional boils have appeared on her chest and under her breast. The dermatologist mentioned the possibility of laser surgery to remove the core of the lesions. She has been prescribed clindamycin and another topical cream to manage flare-ups and has started applying the cream to the recent bump under her arm, which has not changed in size over a few days.  She has been prescribed phentermine  15.2 mg to aid in weight loss  but experiences jitteriness and anxiety, especially at higher doses, leading to irregular use. Despite this, she has been actively working out and has lost weight from 229 lbs in January to 214 lbs currently. She maintains a regular exercise routine, including running and weightlifting, and has a balanced diet with a focus on protein intake. 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   Past Medical History:  Diagnosis Date   Anemia    Sickle cell trait (HCC)    Vitamin D deficiency      Family History  Problem Relation Age of Onset   Diabetes Father    Diabetes Maternal Grandmother      Current Outpatient Medications:    levonorgestrel  (MIRENA ) 20 MCG/24HR IUD, 1 each by Intrauterine route once., Disp: , Rfl:    Multiple Vitamin (MULTIVITAMIN) capsule, Take 1 capsule by mouth daily., Disp: , Rfl:    phentermine  37.5 MG capsule, Take 1 capsule (37.5 mg total) by mouth every morning. (Patient not taking: Reported on 10/20/2023), Disp: 30 capsule, Rfl: 0   No Known Allergies   Review of Systems  Constitutional: Negative.   Respiratory: Negative.    Cardiovascular: Negative.   Gastrointestinal: Negative.   Skin:        Frequent boils  Neurological: Negative.   Psychiatric/Behavioral: Negative.       Today's Vitals   10/20/23 1430  BP: 120/70  Pulse: 82  Temp: 98.4 F (36.9 C)  SpO2:  98%  Weight: 214 lb 3.2 oz (97.2 kg)  Height: 5\' 6"  (1.676 m)   Body mass index is 34.57 kg/m.  Wt Readings from Last 3 Encounters:  10/20/23 214 lb 3.2 oz (97.2 kg)  03/31/20 194 lb (88 kg)  12/05/18 193 lb (87.5 kg)     Objective:  Physical Exam Vitals and nursing note reviewed.  Constitutional:      Appearance: Normal appearance. She is obese.  HENT:     Head: Normocephalic and atraumatic.  Eyes:     Extraocular Movements: Extraocular movements intact.  Cardiovascular:     Rate and Rhythm: Normal rate and regular rhythm.     Heart sounds: Normal  heart sounds.  Pulmonary:     Effort: Pulmonary effort is normal.     Breath sounds: Normal breath sounds.  Musculoskeletal:     Cervical back: Normal range of motion.  Skin:    General: Skin is warm.  Neurological:     General: No focal deficit present.     Mental Status: She is alert.  Psychiatric:        Mood and Affect: Mood normal.        Behavior: Behavior normal.         Assessment And Plan:  Hidradenitis suppurativa Assessment & Plan: Recurrent boils under arms and chest, initially diagnosed as hidradenitis suppurativa. Started laser hair removal and uses clindamycin cream. Small bump under arm post-laser treatment, stable in size. Awaiting definitive diagnosis from Dr. Myrtie Atkinson. Concerned about recurrence and exploring triggers. - Continue clindamycin cream for current bump. - Await appointment with Dr. Myrtie Atkinson on May 12 for further management.  Orders: -     Sedimentation rate -     C-reactive protein  Class 1 obesity due to excess calories without serious comorbidity with body mass index (BMI) of 34.0 to 34.9 in adult Assessment & Plan: Obesity with difficulty losing weight despite increased physical activity and dietary changes. Phentermine  causes jitteriness and anxiety, limiting its use. Interested in injectable medications and weight loss clinic. - Call insurance to check coverage for [facility] or Saxenda. - Refer to [facility] for metabolic rate assessment and meal planning. - Ensure adequate protein intake with each meal to prevent muscle atrophy.  Orders: -     Amb Ref to Medical Weight Management  Encounter to establish care    Return in 5 months (on 03/21/2024), or physical exam.  Patient was given opportunity to ask questions. Patient verbalized understanding of the plan and was able to repeat key elements of the plan. All questions were answered to their satisfaction.   I, Smiley Dung, MD, have reviewed all documentation for this visit. The  documentation on 10/20/23 for the exam, diagnosis, procedures, and orders are all accurate and complete.   IF YOU HAVE BEEN REFERRED TO A SPECIALIST, IT MAY TAKE 1-2 WEEKS TO SCHEDULE/PROCESS THE REFERRAL. IF YOU HAVE NOT HEARD FROM US /SPECIALIST IN TWO WEEKS, PLEASE GIVE US  A CALL AT [phone] X [phone].   THE PATIENT IS ENCOURAGED TO PRACTICE SOCIAL DISTANCING DUE TO THE COVID-19 PANDEMIC.

## 2023-10-21 LAB — SEDIMENTATION RATE: Sed Rate: 20 mm/h (ref 0–32)

## 2023-10-21 LAB — C-REACTIVE PROTEIN: CRP: 5 mg/L (ref 0–10)

## 2023-10-24 ENCOUNTER — Encounter: Payer: Self-pay | Admitting: Internal Medicine

## 2023-10-24 ENCOUNTER — Encounter (INDEPENDENT_AMBULATORY_CARE_PROVIDER_SITE_OTHER): Payer: Self-pay

## 2023-10-30 DIAGNOSIS — L732 Hidradenitis suppurativa: Secondary | ICD-10-CM | POA: Insufficient documentation

## 2023-10-30 DIAGNOSIS — E66811 Obesity, class 1: Secondary | ICD-10-CM | POA: Insufficient documentation

## 2023-10-30 NOTE — Assessment & Plan Note (Signed)
 Recurrent boils under arms and chest, initially diagnosed as hidradenitis suppurativa. Started laser hair removal and uses clindamycin cream. Small bump under arm post-laser treatment, stable in size. Awaiting definitive diagnosis from Dr. Myrtie Atkinson. Concerned about recurrence and exploring triggers. - Continue clindamycin cream for current bump. - Await appointment with Dr. Myrtie Atkinson on May 12 for further management.

## 2023-10-30 NOTE — Assessment & Plan Note (Signed)
 Obesity with difficulty losing weight despite increased physical activity and dietary changes. Phentermine  causes jitteriness and anxiety, limiting its use. Interested in injectable medications and weight loss clinic. - Call insurance to check coverage for [facility] or Saxenda. - Refer to [facility] for metabolic rate assessment and meal planning. - Ensure adequate protein intake with each meal to prevent muscle atrophy.

## 2023-11-07 ENCOUNTER — Encounter: Payer: Self-pay | Admitting: Dermatology

## 2023-11-07 ENCOUNTER — Ambulatory Visit (INDEPENDENT_AMBULATORY_CARE_PROVIDER_SITE_OTHER): Payer: Self-pay | Admitting: Dermatology

## 2023-11-07 VITALS — BP 112/75 | HR 73

## 2023-11-07 DIAGNOSIS — L732 Hidradenitis suppurativa: Secondary | ICD-10-CM | POA: Diagnosis not present

## 2023-11-07 DIAGNOSIS — L723 Sebaceous cyst: Secondary | ICD-10-CM | POA: Diagnosis not present

## 2023-11-07 DIAGNOSIS — L709 Acne, unspecified: Secondary | ICD-10-CM | POA: Diagnosis not present

## 2023-11-07 MED ORDER — DOXYCYCLINE HYCLATE 100 MG PO TABS: 100.0000 mg | ORAL_TABLET | Freq: Two times a day (BID) | ORAL | 6 refills | Status: DC

## 2023-11-07 MED ORDER — FLUCONAZOLE 150 MG PO TABS: 150.0000 mg | ORAL_TABLET | Freq: Every day | ORAL | 3 refills | Status: DC

## 2023-11-07 MED ORDER — SPIRONOLACTONE 100 MG PO TABS: 100.0000 mg | ORAL_TABLET | Freq: Every day | ORAL | 6 refills | Status: AC

## 2023-11-07 MED ORDER — TRIAMCINOLONE ACETONIDE 40 MG/ML IJ SUSP
40.0000 mg | Freq: Once | INTRAMUSCULAR | Status: AC
  Administered 2023-11-07: 40 mg via INTRAMUSCULAR

## 2023-11-07 NOTE — Patient Instructions (Addendum)
 Hello Prentice Brochure,  Thank you for visiting today. Here is a summary of the key instructions:  - Medications:   - Take spironolactone daily with food   - For flares:     - Take doxycycline twice a day for one week     - Use topical clindamycin if available  - Treatments:   - Continue laser hair removal treatments   - Call [facility] to ask about the type of laser used   - Send the laser type information through MyChart  - Lifestyle Changes:   - Cut out sugars, carbs, and red meat for 2 weeks   - Slowly reintroduce these foods to check for flare triggers   - Drink enough water to avoid leg cramping from spironolactone  - Follow-up:   - Return for a follow-up appointment in 4 months   - Call the office if you get a flare before the next appointment  Please reach out if you have any questions or concerns.  Warm regards,  Dr. Louana Roup, Dermatology      Important Information   Due to recent changes in healthcare laws, you may see results of your pathology and/or laboratory studies on MyChart before the doctors have had a chance to review them. We understand that in some cases there may be results that are confusing or concerning to you. Please understand that not all results are received at the same time and often the doctors may need to interpret multiple results in order to provide you with the best plan of care or course of treatment. Therefore, we ask that you please give us  2 business days to thoroughly review all your results before contacting the office for clarification. Should we see a critical lab result, you will be contacted sooner.     If You Need Anything After Your Visit   If you have any questions or concerns for your doctor, please call our main line at [phone]. If no one answers, please leave a voicemail as directed and we will return your call as soon as possible. Messages left after 4 pm will be answered the following business day.    You may also send  us  a message via MyChart. We typically respond to MyChart messages within 1-2 business days.  For prescription refills, please ask your pharmacy to contact our office. Our fax number is [phone].  If you have an urgent issue when the clinic is closed that cannot wait until the next business day, you can page your doctor at the number below.     Please note that while we do our best to be available for urgent issues outside of office hours, we are not available 24/7.    If you have an urgent issue and are unable to reach us , you may choose to seek medical care at your doctor's office, retail clinic, urgent care center, or emergency room.   If you have a medical emergency, please immediately call 911 or go to the emergency department. In the event of inclement weather, please call our main line at [phone] for an update on the status of any delays or closures.  Dermatology Medication Tips: Please keep the boxes that topical medications come in in order to help keep track of the instructions about where and how to use these. Pharmacies typically print the medication instructions only on the boxes and not directly on the medication tubes.   If your medication is too expensive, please contact our office at [phone]  or send us  a message through MyChart.    We are unable to tell what your co-pay for medications will be in advance as this is different depending on your insurance coverage. However, we may be able to find a substitute medication at lower cost or fill out paperwork to get insurance to cover a needed medication.    If a prior authorization is required to get your medication covered by your insurance company, please allow us  1-2 business days to complete this process.   Drug prices often vary depending on where the prescription is filled and some pharmacies may offer cheaper prices.   The website www.goodrx.com contains coupons for medications through different pharmacies. The  prices here do not account for what the cost may be with help from insurance (it may be cheaper with your insurance), but the website can give you the price if you did not use any insurance.  - You can print the associated coupon and take it with your prescription to the pharmacy.  - You may also stop by our office during regular business hours and pick up a GoodRx coupon card.  - If you need your prescription sent electronically to a different pharmacy, notify our office through [facility] or by phone at [phone]

## 2023-11-07 NOTE — Progress Notes (Signed)
 New Patient Visit   Subjective  [name] is a 43 y.o. female who presents for the following: HS  Patient states she has HS located at the B/L Axilla that she would like to have examined. Patient reports the areas have been there for 1 years. She reports the areas are bothersome. She reports the area is very sore. Patient rates irritation 3 out of 10. Patient reports she has previously been treated for these areas. Patient reports she has previously prescribed Topical clindamycin and oral Doxycycline. Currently the patient is still applying topical clindamycin.  Patient reports she is unsure of potential triggers. Currently her washing regimen switches between dial, Hibaclense or Panoxyl BPO. Patient has also tired Laser Hair Removal (Last session was 10/15/23) but unsure it helps.   The following portions of the chart were reviewed this encounter and updated as appropriate: medications, allergies, medical history  Review of Systems:  No other skin or systemic complaints except as noted in HPI or Assessment and Plan.  Objective  Well appearing patient in no apparent distress; mood and affect are within normal limits.  A focused examination was performed of the following areas: B/L Axilla  Relevant exam findings are noted in the Assessment and Plan.    Assessment & Plan   HIDRADENITIS SUPPURATIVA Exam: Tender Cystic Nodule Left Axilla Hurley Stage 1: abscess formation (single or multiple) without sinus tracts or scarring  Flared  Hidradenitis Suppurativa is a chronic; persistent; non-curable, but treatable condition due to abnormal inflamed sweat glands in the body folds (axilla, inframammary, groin, medial thighs), causing recurrent painful draining cysts and scarring. It can be associated with severe scarring acne and cysts; also abscesses and scarring of scalp. The goal is control and prevention of flares, as it is not curable. Scars are permanent and can be thickened. Treatment  may include daily use of topical medication and oral antibiotics.  Oral isotretinoin may also be helpful.  For some cases, Humira or Cosentyx (biologic injections) may be prescribed to decrease the inflammatory process and prevent flares.  When indicated, inflamed cysts may also be treated surgically.  - Assessment: Patient experiencing HS flares since November of last year, with first flare under the arm. On full HS regimen for about a year, including doxycycline for flares and antibacterial washes. Currently presents with tender cystic nodule under left arm (appeared in past week) and another growing cyst present for weeks. Examination reveals very mild HS. Patient has initiated lifestyle modifications including cutting out dairy and attempting weight loss. Started laser hair removal treatments, completed two sessions. Given patient's age (49) and recent onset of symptoms, hormonal factors may be contributing.  - Plan:    Inject cysts with Kenalog to reduce inflammation and prevent need for lancing    Prescribe spironolactone to address potential hormonal influence    Continue current HS regimen:     - Doxycycline for acute flares (take with food, twice daily for one week)     - Antibacterial washes (rotating between Panoxyl, Hibiclens, and Dial)    Continue laser hair removal treatments     - NDYAG laser    Dietary recommendations:     - Continue avoiding dairy     - Trial elimination of sugars, carbs, and red meat for 2 weeks, then reintroduce to identify potential triggers    Patient to call if new flares occur    If current treatment plan is ineffective, consider biologics (e.g., Humira, Cosentyx, Bimzelx) at next appointment  Follow-up  appointment in 4 months for reassessment and evaluation of treatment efficacy.  2. Facial Acne - Assessment: Patient reports developing facial acne since turning 40. May be related to hormonal changes associated with perimenopause.  - Plan:     Spironolactone prescribed for HS is expected to help with facial acne    Full topical regimen to be created at next appointment    Continue using topical clindamycin for flares if available and effective INFLAMED SEBACEOUS CYST (2) Left Axilla, Right Axilla  Return in about 4 months (around 03/09/2024) for HS & Acne F/U.  I, Jetta Ager, am acting as Neurosurgeon for [employer], DO.  Documentation: I have reviewed the above documentation for accuracy and completeness, and I agree with the above.  Louana Roup, DO

## 2023-11-07 NOTE — Telephone Encounter (Signed)
 Yes, that laser is safe in pigmented skin.

## 2023-11-24 ENCOUNTER — Encounter (HOSPITAL_COMMUNITY): Payer: Self-pay | Admitting: Obstetrics and Gynecology

## 2023-11-24 NOTE — Progress Notes (Signed)
 Spoke w/ via phone for pre-op interview--- [name] needs dos---- UPT and BMP per anesthesia. Surgeon orders requested 11/24/23.        Hardy results------ COVID test -----patient states asymptomatic no test needed Arrive at -------0645 NPO after MN NO Solid Food.   Pre-Surgery Ensure or G2:  Med rec completed Medications to take morning of surgery -----NONE Diabetic medication -----  GLP1 agonist last dose:  GLP1 instructions:  Patient instructed no nail polish to be worn day of surgery Patient instructed to bring photo id and insurance card day of surgery Patient aware to have Driver (ride ) / caregiver    for 24 hours after surgery - Boyfriend [name] Patient Special Instructions ----- Shower with antibacterial soap. Hold Phentermine  24 hrs prior to surgery. Pre-Op special Instructions -----  Patient verbalized understanding of instructions that were given at this phone interview. Patient denies chest pain, sob, fever, cough at the interview.

## 2023-12-02 ENCOUNTER — Ambulatory Visit (HOSPITAL_COMMUNITY): Admitting: Anesthesiology

## 2023-12-02 ENCOUNTER — Encounter (HOSPITAL_COMMUNITY)
Admission: RE | Disposition: A | Discharge status: Home / Self Care | Payer: Self-pay | Source: Home / Self Care | Attending: Obstetrics and Gynecology

## 2023-12-02 ENCOUNTER — Ambulatory Visit (HOSPITAL_COMMUNITY)
Admission: RE | Admit: 2023-12-02 | Discharge: 2023-12-02 | Disposition: A | Discharge status: Home / Self Care | Payer: MEDICAID | Attending: Obstetrics and Gynecology | Admitting: Obstetrics and Gynecology

## 2023-12-02 ENCOUNTER — Other Ambulatory Visit: Payer: Self-pay

## 2023-12-02 ENCOUNTER — Encounter (HOSPITAL_COMMUNITY): Payer: Self-pay | Admitting: Obstetrics and Gynecology

## 2023-12-02 DIAGNOSIS — Z302 Encounter for sterilization: Secondary | ICD-10-CM | POA: Diagnosis not present

## 2023-12-02 DIAGNOSIS — Z01818 Encounter for other preprocedural examination: Secondary | ICD-10-CM

## 2023-12-02 DIAGNOSIS — N920 Excessive and frequent menstruation with regular cycle: Secondary | ICD-10-CM | POA: Diagnosis not present

## 2023-12-02 DIAGNOSIS — Z8742 Personal history of other diseases of the female genital tract: Secondary | ICD-10-CM | POA: Diagnosis not present

## 2023-12-02 HISTORY — DX: Hidradenitis suppurativa: L73.2

## 2023-12-02 HISTORY — PX: HYSTEROSCOPY: SHX211

## 2023-12-02 HISTORY — PX: IUD REMOVAL: SHX5392

## 2023-12-02 HISTORY — PX: LAPAROSCOPIC BILATERAL SALPINGECTOMY: SHX5889

## 2023-12-02 LAB — BASIC METABOLIC PANEL WITH GFR
Anion gap: 10 (ref 5–15)
BUN: 11 mg/dL (ref 6–20)
CO2: 21 mmol/L — ABNORMAL LOW (ref 22–32)
Calcium: 9.4 mg/dL (ref 8.9–10.3)
Chloride: 107 mmol/L (ref 98–111)
Creatinine, Ser: 0.82 mg/dL (ref 0.44–1.00)
GFR, Estimated: >60 mL/min (ref >60)
Glucose, Bld: 100 mg/dL — ABNORMAL HIGH (ref 70–99)
Potassium: 4.3 mmol/L (ref 3.5–5.1)
Sodium: 138 mmol/L (ref 135–145)

## 2023-12-02 LAB — CBC
HCT: 45.2 % (ref 36.0–46.0)
Hemoglobin: 14.9 g/dL (ref 12.0–15.0)
MCH: 25.6 pg — ABNORMAL LOW (ref 26.0–34.0)
MCHC: 33 g/dL (ref 30.0–36.0)
MCV: 77.5 fL — ABNORMAL LOW (ref 80.0–100.0)
Platelets: 227 10*3/uL (ref 150–400)
RBC: 5.83 MIL/uL — ABNORMAL HIGH (ref 3.87–5.11)
RDW: 15.1 % (ref 11.5–15.5)
WBC: 12.3 10*3/uL — ABNORMAL HIGH (ref 4.0–10.5)
nRBC: 0 % (ref 0.0–0.2)

## 2023-12-02 LAB — POCT PREGNANCY, URINE: Preg Test, Ur: NEGATIVE

## 2023-12-02 LAB — ABO/RH: ABO/RH(D): B POS

## 2023-12-02 LAB — TYPE AND SCREEN
ABO/RH(D): B POS
Antibody Screen: NEGATIVE

## 2023-12-02 SURGERY — SALPINGECTOMY, BILATERAL, LAPAROSCOPIC
Anesthesia: General | Site: Uterus

## 2023-12-02 MED ORDER — EPHEDRINE SULFATE-NACL 50-0.9 MG/10ML-% IV SOSY
PREFILLED_SYRINGE | INTRAVENOUS | Status: DC | PRN
  Administered 2023-12-02: 5 mg via INTRAVENOUS

## 2023-12-02 MED ORDER — FENTANYL CITRATE (PF) 250 MCG/5ML IJ SOLN
INTRAMUSCULAR | Status: AC
  Filled 2023-12-02: qty 5

## 2023-12-02 MED ORDER — SCOPOLAMINE 1 MG/3DAYS TD PT72
MEDICATED_PATCH | TRANSDERMAL | Status: AC
  Filled 2023-12-02: qty 1

## 2023-12-02 MED ORDER — IBUPROFEN 600 MG PO TABS: 600.0000 mg | ORAL_TABLET | Freq: Four times a day (QID) | ORAL | 1 refills | Status: AC | PRN

## 2023-12-02 MED ORDER — CHLORHEXIDINE GLUCONATE 0.12 % MT SOLN
15.0000 mL | Freq: Once | OROMUCOSAL | Status: AC
  Administered 2023-12-02: 15 mL via OROMUCOSAL

## 2023-12-02 MED ORDER — LIDOCAINE 2% (20 MG/ML) 5 ML SYRINGE
INTRAMUSCULAR | Status: DC | PRN
  Administered 2023-12-02: 80 mg via INTRAVENOUS

## 2023-12-02 MED ORDER — CHLORHEXIDINE GLUCONATE 0.12 % MT SOLN
OROMUCOSAL | Status: AC
  Filled 2023-12-02: qty 15

## 2023-12-02 MED ORDER — ONDANSETRON HCL 4 MG/2ML IJ SOLN
INTRAMUSCULAR | Status: DC | PRN
  Administered 2023-12-02: 4 mg via INTRAVENOUS

## 2023-12-02 MED ORDER — BUPIVACAINE HCL (PF) 0.25 % IJ SOLN
INTRAMUSCULAR | Status: AC
  Filled 2023-12-02: qty 30

## 2023-12-02 MED ORDER — OXYCODONE HCL 5 MG PO TABS: 5.0000 mg | ORAL_TABLET | Freq: Once | ORAL | Status: DC | PRN

## 2023-12-02 MED ORDER — OXYCODONE-ACETAMINOPHEN 5-325 MG PO TABS: 1.0000 | ORAL_TABLET | ORAL | 0 refills | Status: AC | PRN

## 2023-12-02 MED ORDER — OXYCODONE HCL 5 MG/5ML PO SOLN: 5.0000 mg | Freq: Once | ORAL | Status: DC | PRN

## 2023-12-02 MED ORDER — KETOROLAC TROMETHAMINE 30 MG/ML IJ SOLN: 30.0000 mg | Freq: Once | INTRAMUSCULAR | Status: DC | PRN

## 2023-12-02 MED ORDER — KETOROLAC TROMETHAMINE 30 MG/ML IJ SOLN
INTRAMUSCULAR | Status: DC | PRN
Start: 2023-12-02 — End: 2023-12-02
  Administered 2023-12-02: 30 mg via INTRAVENOUS

## 2023-12-02 MED ORDER — PHENYLEPHRINE HCL-NACL 20-0.9 MG/250ML-% IV SOLN: INTRAVENOUS | Status: DC | PRN

## 2023-12-02 MED ORDER — ROCURONIUM BROMIDE 10 MG/ML (PF) SYRINGE
PREFILLED_SYRINGE | INTRAVENOUS | Status: DC | PRN
  Administered 2023-12-02: 70 mg via INTRAVENOUS

## 2023-12-02 MED ORDER — ONDANSETRON HCL 4 MG/2ML IJ SOLN: 4.0000 mg | Freq: Once | INTRAMUSCULAR | Status: DC | PRN

## 2023-12-02 MED ORDER — AMISULPRIDE (ANTIEMETIC) 5 MG/2ML IV SOLN: 10.0000 mg | Freq: Once | INTRAVENOUS | Status: DC | PRN

## 2023-12-02 MED ORDER — MEPERIDINE HCL 25 MG/ML IJ SOLN: 6.2500 mg | INTRAMUSCULAR | Status: DC | PRN

## 2023-12-02 MED ORDER — POVIDONE-IODINE 10 % EX SWAB: 2.0000 | Freq: Once | CUTANEOUS | Status: DC

## 2023-12-02 MED ORDER — MIDAZOLAM HCL 2 MG/2ML IJ SOLN
INTRAMUSCULAR | Status: AC
  Filled 2023-12-02: qty 2

## 2023-12-02 MED ORDER — PROPOFOL 10 MG/ML IV BOLUS
INTRAVENOUS | Status: DC | PRN
  Administered 2023-12-02: 200 mg via INTRAVENOUS

## 2023-12-02 MED ORDER — HYDROMORPHONE HCL 1 MG/ML IJ SOLN
INTRAMUSCULAR | Status: AC
  Filled 2023-12-02: qty 1

## 2023-12-02 MED ORDER — BUPIVACAINE HCL (PF) 0.25 % IJ SOLN
INTRAMUSCULAR | Status: DC | PRN
  Administered 2023-12-02: 10 mL

## 2023-12-02 MED ORDER — ACETAMINOPHEN 500 MG PO TABS
1000.0000 mg | ORAL_TABLET | ORAL | Status: AC
  Administered 2023-12-02: 1000 mg via ORAL

## 2023-12-02 MED ORDER — WHITE PETROLATUM EX OINT
TOPICAL_OINTMENT | CUTANEOUS | Status: AC
  Filled 2023-12-02: qty 28.35

## 2023-12-02 MED ORDER — ALBUMIN HUMAN 5 % IV SOLN: INTRAVENOUS | Status: DC | PRN

## 2023-12-02 MED ORDER — MIDAZOLAM HCL 2 MG/2ML IJ SOLN
INTRAMUSCULAR | Status: DC | PRN
  Administered 2023-12-02: 2 mg via INTRAVENOUS

## 2023-12-02 MED ORDER — DEXAMETHASONE SODIUM PHOSPHATE 10 MG/ML IJ SOLN
INTRAMUSCULAR | Status: DC | PRN
  Administered 2023-12-02: 10 mg via INTRAVENOUS

## 2023-12-02 MED ORDER — FENTANYL CITRATE (PF) 250 MCG/5ML IJ SOLN
INTRAMUSCULAR | Status: DC | PRN
  Administered 2023-12-02: 25 ug via INTRAVENOUS
  Administered 2023-12-02: 50 ug via INTRAVENOUS
  Administered 2023-12-02: 100 ug via INTRAVENOUS

## 2023-12-02 MED ORDER — SUGAMMADEX SODIUM 200 MG/2ML IV SOLN
INTRAVENOUS | Status: DC | PRN
  Administered 2023-12-02: 200 mg via INTRAVENOUS

## 2023-12-02 MED ORDER — PHENYLEPHRINE 80 MCG/ML (10ML) SYRINGE FOR IV PUSH (FOR BLOOD PRESSURE SUPPORT)
PREFILLED_SYRINGE | INTRAVENOUS | Status: DC | PRN
  Administered 2023-12-02 (×2): 80 ug via INTRAVENOUS

## 2023-12-02 MED ORDER — ACETAMINOPHEN 500 MG PO TABS
ORAL_TABLET | ORAL | Status: AC
  Filled 2023-12-02: qty 2

## 2023-12-02 MED ORDER — SODIUM CHLORIDE 0.9 % IR SOLN
Status: DC | PRN
  Administered 2023-12-02: 3000 mL

## 2023-12-02 MED ORDER — DEXMEDETOMIDINE HCL IN NACL 80 MCG/20ML IV SOLN
INTRAVENOUS | Status: DC | PRN
  Administered 2023-12-02 (×3): 4 ug via INTRAVENOUS

## 2023-12-02 MED ORDER — LACTATED RINGERS IV SOLN: INTRAVENOUS | Status: DC

## 2023-12-02 MED ORDER — PROPOFOL 10 MG/ML IV BOLUS
INTRAVENOUS | Status: AC
  Filled 2023-12-02: qty 20

## 2023-12-02 MED ORDER — SCOPOLAMINE 1 MG/3DAYS TD PT72
1.0000 | MEDICATED_PATCH | TRANSDERMAL | Status: DC
  Administered 2023-12-02: 1.5 mg via TRANSDERMAL

## 2023-12-02 MED ORDER — ORAL CARE MOUTH RINSE: 15.0000 mL | Freq: Once | OROMUCOSAL | Status: AC

## 2023-12-02 MED ORDER — HYDROMORPHONE HCL 1 MG/ML IJ SOLN
0.2500 mg | INTRAMUSCULAR | Status: DC | PRN
  Administered 2023-12-02: 0.5 mg via INTRAVENOUS

## 2023-12-02 SURGICAL SUPPLY — 36 items
ABLATOR SURESOUND NOVASURE (ABLATOR) IMPLANT
CATH ROBINSON RED A/P 16FR (CATHETERS) ×2 IMPLANT
COVER MAYO STAND STRL (DRAPES) ×2 IMPLANT
DERMABOND ADVANCED .7 DNX12 (GAUZE/BANDAGES/DRESSINGS) ×2 IMPLANT
DRAPE SURG IRRIG POUCH 19X23 (DRAPES) ×2 IMPLANT
DRSG OPSITE POSTOP 3X4 (GAUZE/BANDAGES/DRESSINGS) IMPLANT
DURAPREP 26ML APPLICATOR (WOUND CARE) ×2 IMPLANT
ELECTRODE REM PT RTRN 9FT ADLT (ELECTROSURGICAL) IMPLANT
FILTER SMOKE EVAC LAPAROSHD (FILTER) ×2 IMPLANT
GLOVE BIO SURGEON STRL SZ 6.5 (GLOVE) ×2 IMPLANT
GLOVE BIOGEL PI IND STRL 7.0 (GLOVE) ×2 IMPLANT
GLOVE SURG UNDER POLY LF SZ7 (GLOVE) ×4 IMPLANT
GOWN STRL REUS W/ TWL LRG LVL3 (GOWN DISPOSABLE) ×4 IMPLANT
IRRIGATION SUCT STRKRFLW 2 WTP (MISCELLANEOUS) IMPLANT
KIT PINK PAD W/HEAD ARE REST (MISCELLANEOUS) ×2 IMPLANT
KIT PINK PAD W/HEAD ARM REST (MISCELLANEOUS) ×2 IMPLANT
KIT PROCEDURE FLUENT (KITS) ×2 IMPLANT
KIT TURNOVER KIT B (KITS) ×2 IMPLANT
MANIPULATOR UTERINE 7CM CLEARV (MISCELLANEOUS) IMPLANT
NS IRRIG 1000ML POUR BTL (IV SOLUTION) ×2 IMPLANT
PACK LAPAROSCOPY BASIN (CUSTOM PROCEDURE TRAY) ×2 IMPLANT
PACK VAGINAL MINOR WOMEN LF (CUSTOM PROCEDURE TRAY) ×2 IMPLANT
PAD OB MATERNITY 11 LF (PERSONAL CARE ITEMS) ×2 IMPLANT
SET TUBE SMOKE EVAC HIGH FLOW (TUBING) ×2 IMPLANT
SHEARS HARMONIC ACE PLUS 36CM (ENDOMECHANICALS) IMPLANT
SLEEVE Z-THREAD 5X100MM (TROCAR) ×2 IMPLANT
SOLUTION ELECTROSURG ANTI STCK (MISCELLANEOUS) IMPLANT
SPIKE FLUID TRANSFER (MISCELLANEOUS) ×2 IMPLANT
SUT VIC AB 3-0 PS2 18XBRD (SUTURE) ×2 IMPLANT
SUT VICRYL 0 UR6 27IN ABS (SUTURE) IMPLANT
SYSTEM BAG RETRIEVAL 10MM (BASKET) IMPLANT
SYSTEM CARTER THOMASON II (TROCAR) IMPLANT
TOWEL GREEN STERILE FF (TOWEL DISPOSABLE) ×4 IMPLANT
TROCAR 11X100 Z THREAD (TROCAR) IMPLANT
TROCAR XCEL NON-BLD 5MMX100MML (ENDOMECHANICALS) ×2 IMPLANT
WARMER LAPAROSCOPE (MISCELLANEOUS) ×2 IMPLANT

## 2023-12-02 NOTE — H&P (Addendum)
 [name] is an 43 y.o. G51P1001 female presenting for surgery Pt has completed childbearing. Only child is 2years old.  She currently has an IUD in place but desires permanent sterilization. She has required US  guided removal of IUD always in past due to strings not visible. No longer wants the anxiety related to that. Cycles heavy without IUD. Desires uterine ablation  Pertinent Gynecological History: Menses: irregular and heavy  Bleeding: intermenstrual bleeding Contraception: IUD DES exposure: denies Blood transfusions: none Sexually transmitted diseases: no past history Previous GYN Procedures: n/a  Last mammogram: normal Date: 07/2023 Last pap: normal Date: 03/2020 OB History: G1, P1001   Menstrual History: Menarche age: 76 No LMP recorded (lmp unknown). (Menstrual status: IUD).    Past Medical History:  Diagnosis Date   Anemia    Hidradenitis suppurativa    Sickle cell trait (HCC)    Vitamin D deficiency     Past Surgical History:  Procedure Laterality Date   COLONOSCOPY  2010   C & B   INTRAUTERINE DEVICE (IUD) INSERTION  04/15/2020   INTRAUTERINE DEVICE INSERTION  04/18/2015   Mirena     Family History  Problem Relation Age of Onset   Diabetes Father    Diabetes Maternal Grandmother     Social History:  reports that she has never smoked. She has never been exposed to tobacco smoke. She has never used smokeless tobacco. She reports current alcohol use. She reports that she does not use drugs.  Allergies: No Known Allergies  Medications Prior to Admission  Medication Sig Dispense Refill Last Dose/Taking   clindamycin (CLINDAGEL) 1 % gel Apply topically 2 (two) times daily.   Past Week   levonorgestrel  (MIRENA ) 20 MCG/24HR IUD 1 each by Intrauterine route once.   12/02/2023 Morning   Multiple Vitamin (MULTIVITAMIN) capsule Take 1 capsule by mouth daily.   Past Week   spironolactone  (ALDACTONE ) 100 MG tablet Take 1 tablet (100 mg total) by mouth daily. 30  tablet 6 Past Week   doxycycline  (VIBRA -TABS) 100 MG tablet Take 1 tablet (100 mg total) by mouth 2 (two) times daily. Take at the first sign of a flare (Patient not taking: Reported on 11/24/2023) 60 tablet 6 More than a month   fluconazole  (DIFLUCAN ) 150 MG tablet Take 1 tablet (150 mg total) by mouth daily. (Patient not taking: Reported on 11/24/2023) 2 tablet 3 More than a month   phentermine  37.5 MG capsule Take 1 capsule (37.5 mg total) by mouth every morning. 30 capsule 0 More than a month    Review of Systems  Constitutional:  Negative for activity change, appetite change, fatigue and fever.  Eyes:  Negative for photophobia and visual disturbance.  Respiratory:  Negative for chest tightness and shortness of breath.   Cardiovascular:  Negative for chest pain, palpitations and leg swelling.  Gastrointestinal:  Negative for abdominal pain.  Genitourinary:  Positive for menstrual problem.  Neurological:  Negative for light-headedness and numbness.  Psychiatric/Behavioral:  The patient is nervous/anxious.     Blood pressure 131/83, pulse 71, temperature 98.1 F (36.7 C), temperature source Oral, resp. rate 16, height 5' 5.5" (1.664 m), weight 93.4 kg, SpO2 (!) 83%. Physical Exam Constitutional:      Appearance: Normal appearance. She is normal weight.  Cardiovascular:     Rate and Rhythm: Normal rate.     Pulses: Normal pulses.  Pulmonary:     Effort: Pulmonary effort is normal.  Genitourinary:    General: Normal vulva.  Musculoskeletal:  General: Normal range of motion.     Cervical back: Normal range of motion.  Skin:    General: Skin is warm and dry.     Capillary Refill: Capillary refill takes 2 to 3 seconds.  Neurological:     General: No focal deficit present.     Mental Status: She is alert and oriented to person, place, and time. Mental status is at baseline.  Psychiatric:        Mood and Affect: Mood normal.        Behavior: Behavior normal.        Thought  Content: Thought content normal.        Judgment: Judgment normal.     Results for orders placed or performed during the hospital encounter of 12/02/23 (from the past 24 hours)  Pregnancy, urine POC     Status: None   Collection Time: 12/02/23  7:06 AM  Result Value Ref Range   Preg Test, Ur NEGATIVE NEGATIVE  CBC     Status: Abnormal   Collection Time: 12/02/23  8:06 AM  Result Value Ref Range   WBC 12.3 (H) 4.0 - 10.5 K/uL   RBC 5.83 (H) 3.87 - 5.11 MIL/uL   Hemoglobin 14.9 12.0 - 15.0 g/dL   HCT 27.2 53.6 - 64.4 %   MCV 77.5 (L) 80.0 - 100.0 fL   MCH 25.6 (L) 26.0 - 34.0 pg   MCHC 33.0 30.0 - 36.0 g/dL   RDW 03.4 74.2 - 59.5 %   Platelets 227 150 - 400 K/uL   nRBC 0.0 0.0 - 0.2 %  Type and screen [facility]     Status: None (Preliminary result)   Collection Time: 12/02/23  8:06 AM  Result Value Ref Range   ABO/RH(D) PENDING    Antibody Screen PENDING    Sample Expiration      12/05/2023,2359 Performed at [facility], [address] [place], [place] [address]   ABO/Rh     Status: None (Preliminary result)   Collection Time: 12/02/23  8:09 AM  Result Value Ref Range   ABO/RH(D) PENDING     No results found.  Assessment/Plan: 42yo G1P1001 here for laparoscopic bilateral tubal excision and removal of IUD and hysteroscopic uterine ablation likely with novasure  - Admit  - ERAS protocol  - Consent confirmed  - To OR when ready   [name] 12/02/2023, 8:38 AM

## 2023-12-02 NOTE — Discharge Instructions (Addendum)
 Call office with any concerns [phone]   Post Anesthesia Home Care Instructions  Activity: Get plenty of rest for the remainder of the day. A responsible individual must stay with you for 24 hours following the procedure.  For the next 24 hours, DO NOT: -Drive a car -Advertising copywriter -Drink alcoholic beverages -Take any medication unless instructed by your physician -Make any legal decisions or sign important papers.  Meals: Start with liquid foods such as gelatin or soup. Progress to regular foods as tolerated. Avoid greasy, spicy, heavy foods. If nausea and/or vomiting occur, drink only clear liquids until the nausea and/or vomiting subsides. Call your physician if vomiting continues.  Special Instructions/Symptoms: Your throat may feel dry or sore from the anesthesia or the breathing tube placed in your throat during surgery. If this causes discomfort, gargle with warm salt water. The discomfort should disappear within 24 hours.  If you had a scopolamine patch placed behind your ear for the management of post- operative nausea and/or vomiting:  1. The medication in the patch is effective for 72 hours, after which it should be removed.  Wrap patch in a tissue and discard in the trash. Wash hands thoroughly with soap and water. 2. You may remove the patch earlier than 72 hours if you experience unpleasant side effects which may include dry mouth, dizziness or visual disturbances. 3. Avoid touching the patch. Wash your hands with soap and water after contact with the patch.    No ibuprofen , Advil , Aleve, Motrin , ketorolac, meloxicam, naproxen, or other NSAIDS until after 4 pm today if needed. No acetaminophen /Tylenol  until after 1:45 pm today if needed.

## 2023-12-02 NOTE — Anesthesia Procedure Notes (Signed)
 Procedure Name: Intubation Date/Time: 12/02/2023 9:19 AM  Performed by: Hershall Lory, CRNAPre-anesthesia Checklist: Patient identified, Emergency Drugs available, Suction available and Patient being monitored Patient Re-evaluated:Patient Re-evaluated prior to induction Oxygen Delivery Method: Circle System Utilized Preoxygenation: Pre-oxygenation with 100% oxygen Induction Type: IV induction Ventilation: Mask ventilation without difficulty Laryngoscope Size: Mac and 4 Grade View: Grade II Tube type: Oral Tube size: 7.0 mm Number of attempts: 1 Airway Equipment and Method: Stylet and Oral airway Placement Confirmation: ETT inserted through vocal cords under direct vision, positive ETCO2 and breath sounds checked- equal and bilateral Secured at: 21 cm Tube secured with: Tape Dental Injury: Teeth and Oropharynx as per pre-operative assessment

## 2023-12-02 NOTE — Anesthesia Preprocedure Evaluation (Addendum)
 Anesthesia Evaluation  Patient identified by MRN, date of birth, ID band Patient awake    Reviewed: Allergy & Precautions, H&P , NPO status , Patient's Chart, lab work & pertinent test results  Airway Mallampati: I  TM Distance: >3 FB Neck ROM: Full    Dental  (+) Teeth Intact, Dental Advisory Given   Pulmonary neg pulmonary ROS   Pulmonary exam normal breath sounds clear to auscultation       Cardiovascular negative cardio ROS Normal cardiovascular exam Rhythm:Regular Rate:Normal     Neuro/Psych negative neurological ROS  negative psych ROS   GI/Hepatic negative GI ROS, Neg liver ROS,,,  Endo/Other  BMI 34  Renal/GU negative Renal ROS  negative genitourinary   Musculoskeletal negative musculoskeletal ROS (+)    Abdominal  (+) + obese  Peds negative pediatric ROS (+)  Hematology  (+) Blood dyscrasia, Sickle cell trait   Anesthesia Other Findings   Reproductive/Obstetrics negative OB ROS Urine preg neg today                              Anesthesia Physical Anesthesia Plan  ASA: 2  Anesthesia Plan: General   Post-op Pain Management: Tylenol  PO (pre-op)*, Toradol IV (intra-op)* and Precedex   Induction: Intravenous  PONV Risk Score and Plan: 3 and Ondansetron, Dexamethasone, Midazolam and Treatment may vary due to age or medical condition  Airway Management Planned: Oral ETT  Additional Equipment: None  Intra-op Plan:   Post-operative Plan: Extubation in OR  Informed Consent: I have reviewed the patients History and Physical, chart, labs and discussed the procedure including the risks, benefits and alternatives for the proposed anesthesia with the patient or authorized representative who has indicated his/her understanding and acceptance.     Dental advisory given  Plan Discussed with: CRNA  Anesthesia Plan Comments:        Anesthesia Quick Evaluation

## 2023-12-02 NOTE — Anesthesia Procedure Notes (Signed)
 Procedure Name: Intubation Date/Time: 12/03/2023 9:19 AM  Performed by: Hershall Lory, CRNAPre-anesthesia Checklist: Patient identified, Emergency Drugs available, Suction available and Patient being monitored Patient Re-evaluated:Patient Re-evaluated prior to induction Oxygen Delivery Method: Circle System Utilized Preoxygenation: Pre-oxygenation with 100% oxygen Induction Type: IV induction Ventilation: Mask ventilation without difficulty Laryngoscope Size: 4 and Mac Grade View: Grade II Tube type: Oral Tube size: 7.0 mm Number of attempts: 1 Airway Equipment and Method: Stylet and Oral airway Placement Confirmation: ETT inserted through vocal cords under direct vision, positive ETCO2 and breath sounds checked- equal and bilateral Secured at: 21 cm Tube secured with: Tape Dental Injury: Teeth and Oropharynx as per pre-operative assessment

## 2023-12-02 NOTE — Op Note (Addendum)
 Operative Note    Preoperative Diagnosis Complete family status/ requests sterilization Mechanical complication of IUD  Menorrhagia    Postoperative Diagnosis: Same    Procedure: Laparoscopic bilateral tubal salpingectomy , removal of intrauterine device and hysteroscopic uterine ablation after dilation and curettage    Surgeon: Carollee Circle DO  Assist: Lovell Rubenstein NP     An assistant was required given the standard of surgical care given the complexity of the case and maternal body habitus.  This assistant was needed for exposure, dissection, suctioning, retraction, instrument exchange,and for overall help during the procedure.    Anesthesia: General and 1% marcaine   Fluids:: LR EBL: 10ml UOP: voided prior to OR  Fluid Deficit L  Findings: Grossly normal anteverted uterus, normal bilateral fallopian tubes and ovaries . Uterine cavity with secretory tissue and suspected adhesion vs stringy polyp , ostia in normal anatomic position  Specimen: Endometrial curettings    Procedure Note Patient was taken to the operating room where general anesthesia was administered without difficulty. She was then prepped and draped in the normal sterile fashion in the dorsal lithotomy position. An appropriate timeout was performed. Pt had voided prior to coming to OR A speculum was placed in the vagina. The Intrauterine device was removed with no complications. and a ClearVue placed for uterine manipulation.  Attention was then turned to the patient's abdomen. 5cc of 0.25% marcaine plain was injected in the umbilicus and an incision made. A  5mm optiview scope placed,  Gas flow was then applied and a pneumoperitoneum obtained with approximate 3 L of CO2 gas.  With patient in Trendelenburg the uterus and tubes and ovaries were inspected and noted to be grossly normal. A second, and third, 5mm trocars were placed under direct visualization the left and right ( respectfully) lower  quadrants.   Both fallopian tubes were easily identified and followed to their fimbriated ends. No gross abnormalities noted.  A harmonic scalpel was then introduced and the fallopian tubes excised to within 1cm from the cornua. Hemostasis was noted. The remainder of the pelvis and abdomen were inspected with no injuries or bleeding noted.  Trocars were removed under visualization, Pneumoperitoneum allowed to escape with manual valsalva via anesthesia assistance.  The incisions were closed with 4-0 vicryl suture and dermabond applied.   Next, patients legs were raised. The speculum was replaced cervix dilated to accommodate at hysteroscope. The uterine cavity was observed to have a follicular appearing lining and a thin band of tissue , possibly a polyp vs an adhesion was noted. A sharp curettage was performed and tissue sent to pathology. Cervical length was  measured at 3.5 cm.   The Novasure device was then inserted to the top of the fundus and deployed.  A cavity width of 2.5cm was measured.  The device was activated with a treatment time of 11 secs. The hysteroscope was then replaced and the cavity noted to have a good treatment effect with blanching and no viable endometrium apparent.   All instruments were removed from the vagina.  The tenaculum site was hemostatic.  Finally the speculum was removed from the vagina and the patient awakened and taken to the recovery room in stable condition.  All counts were noted to be correct x 2

## 2023-12-02 NOTE — Transfer of Care (Signed)
 Immediate Anesthesia Transfer of Care Note  Patient: [name]  Procedure(s) Performed: SALPINGECTOMY, BILATERAL, LAPAROSCOPIC (Bilateral: Pelvis) REMOVAL, INTRAUTERINE DEVICE (Uterus) ABLATION, ENDOMETRIUM, HYSTEROSCOPIC (Uterus)  Patient Location: PACU  Anesthesia Type:General  Level of Consciousness: awake and alert   Airway & Oxygen Therapy: Patient Spontanous Breathing  Post-op Assessment: Report given to RN  Post vital signs: Reviewed and stable  Last Vitals:  Vitals Value Taken Time  BP 121/74 12/02/23 1033  Temp 36.9 C 12/02/23 1037  Pulse 85 12/02/23 1038  Resp 16 12/02/23 1038  SpO2 100 % 12/02/23 1038  Vitals shown include unfiled device data.  Last Pain:  Vitals:   12/02/23 0738  TempSrc: Oral  PainSc: 0-No pain      Patients Stated Pain Goal: 5 (12/02/23 1610)  Complications: No notable events documented.

## 2023-12-02 NOTE — Anesthesia Postprocedure Evaluation (Signed)
 Anesthesia Post Note  Patient: [name]  Procedure(s) Performed: SALPINGECTOMY, BILATERAL, LAPAROSCOPIC (Bilateral: Pelvis) REMOVAL, INTRAUTERINE DEVICE (Uterus) ABLATION, ENDOMETRIUM, HYSTEROSCOPIC (Uterus)     Patient location during evaluation: Phase II Anesthesia Type: General Level of consciousness: awake and alert, oriented and patient cooperative Pain management: pain level controlled Vital Signs Assessment: post-procedure vital signs reviewed and stable Respiratory status: spontaneous breathing, nonlabored ventilation and respiratory function stable Cardiovascular status: blood pressure returned to baseline and stable Postop Assessment: no apparent nausea or vomiting Anesthetic complications: no   No notable events documented.  Last Vitals:  Vitals:   12/02/23 1100 12/02/23 1115  BP: 120/72 116/67  Pulse: 79 82  Resp: 19 14  Temp:  36.7 C  SpO2: 98% 94%    Last Pain:  Vitals:   12/02/23 1115  TempSrc:   PainSc: 3                  Jacquelyne Matte

## 2023-12-03 ENCOUNTER — Encounter (HOSPITAL_COMMUNITY): Payer: Self-pay | Admitting: Obstetrics and Gynecology

## 2023-12-05 LAB — SURGICAL PATHOLOGY

## 2024-02-09 ENCOUNTER — Encounter: Admitting: Internal Medicine

## 2024-03-12 ENCOUNTER — Ambulatory Visit (INDEPENDENT_AMBULATORY_CARE_PROVIDER_SITE_OTHER): Payer: Self-pay | Admitting: Dermatology

## 2024-03-12 ENCOUNTER — Encounter: Payer: Self-pay | Admitting: Dermatology

## 2024-03-12 VITALS — BP 124/82

## 2024-03-12 DIAGNOSIS — R238 Other skin changes: Secondary | ICD-10-CM

## 2024-03-12 DIAGNOSIS — T490X5A Adverse effect of local antifungal, anti-infective and anti-inflammatory drugs, initial encounter: Secondary | ICD-10-CM

## 2024-03-12 DIAGNOSIS — L700 Acne vulgaris: Secondary | ICD-10-CM

## 2024-03-12 DIAGNOSIS — L732 Hidradenitis suppurativa: Secondary | ICD-10-CM | POA: Diagnosis not present

## 2024-03-12 MED ORDER — TRETINOIN 0.025 % EX CREA: TOPICAL_CREAM | Freq: Every day | CUTANEOUS | 5 refills | Status: AC

## 2024-03-12 NOTE — Progress Notes (Signed)
   Follow-Up Visit   Subjective  [name] is a 43 y.o. female who presents for the following: HS of axillary areas follow up - She has not had any flares since last appointment. She alternates between BP wash and Hibiclens . She has had 4 sessions of laser hair removal and has 4 sessions left.  She is also following up for facial acne. She did not start spironolactone . She has had one bump since her last appointment. She used clindamycin and it went right away. She had a salpingectomy and ablation in June and her acne seems to be controlled since her procedure.   The following portions of the chart were reviewed this encounter and updated as appropriate: medications, allergies, medical history  Review of Systems:  No other skin or systemic complaints except as noted in HPI or Assessment and Plan.  Objective  Well appearing patient in no apparent distress; mood and affect are within normal limits.  A focused examination was performed of the following areas: Face  Relevant exam findings are noted in the Assessment and Plan.    Assessment & Plan    1. Acne Vulgaris with Post-Inflammatory Changes - Assessment: Patient reports significant improvement in persistent acne spots from previous visit.  Hormonal effects on skin decreased following tubal removal with ovaries intact. Currently uses clindaymcin for flares only. No active lesions today on PE  - Plan:    Continue clindamycin as needed for flares    Start tretinoin  cream two nights per week    Avoid application to lips, eyes, and nose creases    Use heavy moisturizer on top    Pause for one week if stinging or peeling occurs, then reduce to once weekly    Provide samples of Sickle-Fate by Avene moisturizer  2. Hidradenitis Suppurativa with Post-ILK atrophy skin changes - Assessment: No recent flares under the arms. Small bump appeared after laser hair removal, responded to clindamycin treatment but developed tunneling. No  cysts or boils reported since then. Currently no active flares observed.  Hypopigmentation and local trophy   - Plan:    Continue current management    Monitor for recurrence of symptoms    Complete remaining four laser hair removal sessions with Candela YAG laser    Apply numbing cream 2 hours before treatment for optimal effect    Follow-up in May to adjust regimen for summer and assess treatment response.    No follow-ups on file.  I, Roseline Hutchinson, CMA, am acting as scribe for [employer], DO .   Documentation: I have reviewed the above documentation for accuracy and completeness, and I agree with the above.  Delon Lenis, DO

## 2024-03-12 NOTE — Patient Instructions (Addendum)
 Date: Mon Mar 12 2024  Hello [name],  Thank you for visiting today. Here is a summary of the key instructions:  - Medications:   - Continue using clindamycin as needed for bacteria control   - Start using tretinoin  two nights a week  - Skin Care:   - Use a heavy moisturizer on top of tretinoin    - Try Sickle-Fate by [employer]vene moisturizer (samples provided)   - Avoid applying to lips, eyes, and nose creases   - If stinging or peeling occurs, pause for a week and reduce to one night a week  - Treatment Areas:   - Continue with remaining four laser hair removal sessions   - Apply numbing cream two hours before each session for best results  - Follow-up:   - Next appointment in May to adjust skincare regimen for summer  - Other Instructions:   - Consider taking collagen supplements like [employer]  Please reach out if you have any questions or concerns.  Warm regards,  Dr. [facility] Lenis [facility]        Important Information  Due to recent changes in healthcare laws, you may see results of your pathology and/or laboratory studies on MyChart before the doctors have had a chance to review them. We understand that in some cases there may be results that are confusing or concerning to you. Please understand that not all results are received at the same time and often the doctors may need to interpret multiple results in order to provide you with the best plan of care or course of treatment. Therefore, we ask that you please give us  2 business days to thoroughly review all your results before contacting the office for clarification. Should we see a critical lab result, you will be contacted sooner.   If You Need Anything After Your Visit  If you have any questions or concerns for your doctor, please call our main line at [phone] If no one answers, please leave a voicemail as directed and we will return your call as soon as possible. Messages left after 4 pm will be  answered the following business day.   You may also send us  a message via MyChart. We typically respond to MyChart messages within 1-2 business days.  For prescription refills, please ask your pharmacy to contact our office. Our fax number is [phone].  If you have an urgent issue when the clinic is closed that cannot wait until the next business day, you can page your doctor at the number below.    Please note that while we do our best to be available for urgent issues outside of office hours, we are not available 24/7.   If you have an urgent issue and are unable to reach us , you may choose to seek medical care at your doctor's office, retail clinic, urgent care center, or emergency room.  If you have a medical emergency, please immediately call 911 or go to the emergency department. In the event of inclement weather, please call our main line at [phone] for an update on the status of any delays or closures.  Dermatology Medication Tips: Please keep the boxes that topical medications come in in order to help keep track of the instructions about where and how to use these. Pharmacies typically print the medication instructions only on the boxes and not directly on the medication tubes.   If your medication is too expensive, please contact our office at [phone] or send us  a message through  MyChart.   We are unable to tell what your co-pay for medications will be in advance as this is different depending on your insurance coverage. However, we may be able to find a substitute medication at lower cost or fill out paperwork to get insurance to cover a needed medication.   If a prior authorization is required to get your medication covered by your insurance company, please allow us  1-2 business days to complete this process.  Drug prices often vary depending on where the prescription is filled and some pharmacies may offer cheaper prices.  The website www.goodrx.com contains  coupons for medications through different pharmacies. The prices here do not account for what the cost may be with help from insurance (it may be cheaper with your insurance), but the website can give you the price if you did not use any insurance.  - You can print the associated coupon and take it with your prescription to the pharmacy.  - You may also stop by our office during regular business hours and pick up a GoodRx coupon card.  - If you need your prescription sent electronically to a different pharmacy, notify our office through [facility] or by phone at [phone]

## 2024-03-14 ENCOUNTER — Encounter: Payer: Self-pay | Admitting: Dermatology

## 2024-10-29 ENCOUNTER — Ambulatory Visit: Admitting: Dermatology
